# Patient Record
Sex: Female | Born: 1995 | Race: White | Hispanic: No | Marital: Single | Smoking: Never smoker
Health system: Southern US, Community
[De-identification: ages and names within clinical notes are randomized; demographics above are authoritative.]

## PROBLEM LIST (undated history)

## (undated) DIAGNOSIS — Z87820 Personal history of traumatic brain injury: Secondary | ICD-10-CM

## (undated) DIAGNOSIS — R569 Unspecified convulsions: Secondary | ICD-10-CM

## (undated) HISTORY — DX: Unspecified convulsions: R56.9

## (undated) HISTORY — DX: Personal history of traumatic brain injury: Z87.820

---
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
## 2015-07-29 ENCOUNTER — Ambulatory Visit (INDEPENDENT_AMBULATORY_CARE_PROVIDER_SITE_OTHER): Payer: BLUE CROSS/BLUE SHIELD | Admitting: Family Medicine

## 2015-07-29 VITALS — BP 100/68 | HR 53 | Temp 97.7°F | Resp 14 | Ht 67.0 in | Wt 131.0 lb

## 2015-07-29 DIAGNOSIS — J029 Acute pharyngitis, unspecified: Secondary | ICD-10-CM | POA: Diagnosis not present

## 2015-07-29 MED ORDER — PENICILLIN V POTASSIUM 500 MG PO TABS: 500.0000 mg | ORAL_TABLET | Freq: Three times a day (TID) | ORAL | Status: DC

## 2015-07-29 NOTE — Progress Notes (Signed)
@  [facility]@  By signing my name below, I, [name], attest that this documentation has been prepared under the direction and in the presence of Elvina SidleKurt Oluwadarasimi Favor, MD.  Electronically Signed: [name], ED Scribe. 07/29/2015. 3:09 PM.  Patient ID: Dominique Carter MRN: [id number], DOB: Jul 20, 1996, 19 y.o. Date of Encounter: 07/29/2015, 3:09 PM  Primary Physician: Tally DueGUEST, CHRIS WARREN, MD  Chief Complaint:  Chief Complaint  Patient presents with  . Cough    productive  . Sore Throat    HPI: 19 y.o. year old female with history below presents with a sore throat that began 5-6 days ago  She reports associated fatigue and cough, worse at night. .No measured fever, nausea, and emesis.  Pt plays basketball at [employer] and is supposed to return to practice tomorrow.   History reviewed. No pertinent past medical history.   Home Meds: Prior to Admission medications   Not on File    Allergies: No Known Allergies  Social History   Social History  . Marital Status: Single    Spouse Name: N/A  . Number of Children: N/A  . Years of Education: N/A   Occupational History  . Not on file.   Social History Main Topics  . Smoking status: Never Smoker   . Smokeless tobacco: Not on file  . Alcohol Use: Not on file  . Drug Use: Not on file  . Sexual Activity: Not on file   Other Topics Concern  . Not on file   Social History Narrative    Review of Systems: Constitutional: negative for chills, fever, night sweats, weight changes. HEENT: negative for vision changes, hearing loss, congestion, rhinorrhea, ST, epistaxis, or sinus pressure Cardiovascular: negative for chest pain or palpitations Respiratory: negative for hemoptysis, wheezing, shortness of breath Abdominal: negative for abdominal pain, nausea, vomiting, diarrhea, or constipation Dermatological: negative for rash Neurologic: negative for headache, dizziness, or syncope All other systems reviewed and are  otherwise negative with the exception to those above and in the HPI.   Physical Exam: Blood pressure 100/68, pulse 53, temperature 97.7 F (36.5 C), temperature source Oral, resp. rate 14, height 5\' 7"  (1.702 m), weight 131 lb (59.421 kg), SpO2 98 %., Body mass index is 20.51 kg/(m^2). General: Well developed, well nourished, in no acute distress. Head: Normocephalic, atraumatic, eyes without discharge, sclera non-icteric, nares are without discharge. Bilateral auditory canals clear, TM's are without perforation, pearly grey and translucent with reflective cone of light bilaterally. Oral cavity moist, posterior pharynx with erythema   Neck: Supple. No thyromegaly. Full ROM. No lymphadenopathy. Lungs: Clear bilaterally to auscultation without wheezes, rales, or rhonchi. Breathing is unlabored. Msk:  Strength and tone normal for age. Extremities/Skin: Warm and dry. No clubbing or cyanosis. No edema. No rashes or suspicious lesions. Neuro: Alert and oriented X 3. Moves all extremities spontaneously. Gait is normal. CNII-XII grossly in tact. Psych:  Responds to questions appropriately with a normal affect.    ASSESSMENT AND PLAN:  19 y.o. year old female with     ICD-9-CM ICD-10-CM   1. Acute pharyngitis, unspecified etiology 462 J02.9 Culture, Group A Strep     penicillin v potassium (VEETID) 500 MG tablet  This chart was scribed in my presence and reviewed by me personally.   Signed, Elvina SidleKurt Marvene Strohm, MD 07/29/2015 3:09 PM

## 2015-07-31 LAB — CULTURE, GROUP A STREP: Organism ID, Bacteria: NORMAL

## 2016-03-12 DIAGNOSIS — J45990 Exercise induced bronchospasm: Secondary | ICD-10-CM | POA: Insufficient documentation

## 2016-03-12 DIAGNOSIS — Z0000 Encounter for general adult medical examination without abnormal findings: Secondary | ICD-10-CM | POA: Insufficient documentation

## 2018-06-20 ENCOUNTER — Encounter (HOSPITAL_COMMUNITY): Payer: Self-pay | Admitting: *Deleted

## 2018-06-20 ENCOUNTER — Other Ambulatory Visit: Payer: Self-pay

## 2018-06-20 ENCOUNTER — Emergency Department (HOSPITAL_COMMUNITY): Payer: No Typology Code available for payment source

## 2018-06-20 ENCOUNTER — Emergency Department (HOSPITAL_COMMUNITY)
Admission: EM | Admit: 2018-06-20 | Discharge: 2018-06-20 | Disposition: A | Discharge status: Home / Self Care | Payer: No Typology Code available for payment source | Attending: Emergency Medicine | Admitting: Emergency Medicine

## 2018-06-20 DIAGNOSIS — R1032 Left lower quadrant pain: Secondary | ICD-10-CM | POA: Diagnosis present

## 2018-06-20 DIAGNOSIS — R110 Nausea: Secondary | ICD-10-CM | POA: Diagnosis not present

## 2018-06-20 LAB — CBC WITH DIFFERENTIAL/PLATELET
Abs Immature Granulocytes: 0.01 10*3/uL (ref 0.00–0.07)
BASOS ABS: 0 10*3/uL (ref 0.0–0.1)
Basophils Relative: 1 %
EOS PCT: 1 %
Eosinophils Absolute: 0 10*3/uL (ref 0.0–0.5)
HEMATOCRIT: 40.1 % (ref 36.0–46.0)
HEMOGLOBIN: 13.1 g/dL (ref 12.0–15.0)
IMMATURE GRANULOCYTES: 0 %
LYMPHS ABS: 1.5 10*3/uL (ref 0.7–4.0)
LYMPHS PCT: 25 %
MCH: 31.3 pg (ref 26.0–34.0)
MCHC: 32.7 g/dL (ref 30.0–36.0)
MCV: 95.7 fL (ref 80.0–100.0)
Monocytes Absolute: 0.7 10*3/uL (ref 0.1–1.0)
Monocytes Relative: 11 %
NEUTROS PCT: 62 %
NRBC: 0 % (ref 0.0–0.2)
Neutro Abs: 3.7 10*3/uL (ref 1.7–7.7)
Platelets: 274 10*3/uL (ref 150–400)
RBC: 4.19 MIL/uL (ref 3.87–5.11)
RDW: 11.6 % (ref 11.5–15.5)
WBC: 5.9 10*3/uL (ref 4.0–10.5)

## 2018-06-20 LAB — COMPREHENSIVE METABOLIC PANEL
ALT: 14 U/L (ref 0–44)
ANION GAP: 6 (ref 5–15)
AST: 17 U/L (ref 15–41)
Albumin: 4.1 g/dL (ref 3.5–5.0)
Alkaline Phosphatase: 47 U/L (ref 38–126)
BUN: 12 mg/dL (ref 6–20)
CHLORIDE: 106 mmol/L (ref 98–111)
CO2: 27 mmol/L (ref 22–32)
Calcium: 9.8 mg/dL (ref 8.9–10.3)
Creatinine, Ser: 0.99 mg/dL (ref 0.44–1.00)
GFR calc non Af Amer: >60 mL/min (ref >60)
GLUCOSE: 107 mg/dL — AB (ref 70–99)
POTASSIUM: 3.7 mmol/L (ref 3.5–5.1)
Sodium: 139 mmol/L (ref 135–145)
Total Bilirubin: 1.7 mg/dL — ABNORMAL HIGH (ref 0.3–1.2)
Total Protein: 7.4 g/dL (ref 6.5–8.1)

## 2018-06-20 LAB — URINALYSIS, ROUTINE W REFLEX MICROSCOPIC
BILIRUBIN URINE: NEGATIVE
Glucose, UA: NEGATIVE mg/dL
Hgb urine dipstick: NEGATIVE
KETONES UR: NEGATIVE mg/dL
Leukocytes, UA: NEGATIVE
NITRITE: NEGATIVE
PH: 7 (ref 5.0–8.0)
Protein, ur: NEGATIVE mg/dL
SPECIFIC GRAVITY, URINE: 1.02 (ref 1.005–1.030)

## 2018-06-20 LAB — LIPASE, BLOOD: Lipase: 36 U/L (ref 11–51)

## 2018-06-20 LAB — I-STAT BETA HCG BLOOD, ED (MC, WL, AP ONLY)

## 2018-06-20 MED ORDER — NAPROXEN 500 MG PO TABS: 500.0000 mg | ORAL_TABLET | Freq: Two times a day (BID) | ORAL | 0 refills | Status: DC

## 2018-06-20 NOTE — ED Provider Notes (Signed)
Medical screening examination/treatment/procedure(s) were conducted as a shared visit with non-physician practitioner(s) and myself.  I personally evaluated the patient during the encounter.  Clinical Impression:   Final diagnoses:  Left lower quadrant abdominal pain   22 year old female, otherwise healthy, she does have a history of ovarian cyst but has never had any pregnancy issues, she is not currently pregnant, she is not sexually active, she has no urinary symptoms and other than an episode of diarrhea last week she has not had any bowel problems this week.  She did vomit once this morning and the pain is become more persistent in the left lower quadrant.  No fevers and on my exam she has a soft non-peritoneal abdomen with focal tenderness in the left lower quadrant and no CVA tenderness.  Labs pending, urinalysis, pelvic ultrasound to rule out ovarian pathology such as cyst or much less likely torsion.  Patient agreeable   Eber HongMiller, Hessie Varone, MD 06/21/18 1201

## 2018-06-20 NOTE — ED Provider Notes (Signed)
[facility] EMERGENCY DEPARTMENT Provider Note   CSN: [id number] Arrival date & time: 06/20/18  1205   History   Chief Complaint Chief Complaint  Patient presents with  . Abdominal Pain    HPI Dominique Carter is a 23 y.o. female presenting with LLQ abdominal pain onset 1 week ago. Patient reports pain was intermittent at first, but today pain has been constant, sharp, and radiates to LUQ. Patient rates pain as 5/10. Patient reports she has tried tylenol, ibuprofen, and Tums without relief. Patient reports nothing makes the pain better or worse. 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 Patient reports she had one episode of nonbloody vomiting early this morning, but denies any other episodes. Patient reports she had a few episodes of diarrhea 1 week ago, but denies any recent episodes. Patient reports she was told she had an ovarian cyst on her left ovary in the past. Patient reports occasional alcohol use, but denies drug or tobacco use. Patient denies urinary symptoms, vaginal discharge/bleeding. Last LMP at the end of October. Last BM was yesterday.  HPI  History reviewed. No pertinent past medical history.  There are no active problems to display for this patient.   History reviewed. No pertinent surgical history.   OB History   None      Home Medications    Prior to Admission medications   Medication Sig Start Date End Date Taking? Authorizing Provider  naproxen (NAPROSYN) 500 MG tablet Take 1 tablet (500 mg total) by mouth 2 (two) times daily. 06/20/18   Carlyle Basques P, PA-C  penicillin v potassium (VEETID) 500 MG tablet Take 1 tablet (500 mg total) by mouth 3 (three) times daily. 07/29/15   Elvina Sidle, MD    Family History Family History  Problem Relation Age of Onset  . Hypertension Paternal Grandmother   . Cancer Paternal Grandfather     Social History Social History   Tobacco Use  . Smoking  status: Never Smoker  . Smokeless tobacco: Never Used  Substance Use Topics  . Alcohol use: Never    Frequency: Never  . Drug use: Never     Allergies   Patient has no known allergies.   Review of Systems Review of Systems  Constitutional: Negative for activity change, appetite change, chills, fever and unexpected weight change.  HENT: Negative for congestion, rhinorrhea and sore throat.   Eyes: Negative for visual disturbance.  Respiratory: Negative for cough and shortness of breath.   Cardiovascular: Negative for chest pain.  Gastrointestinal: Positive for abdominal pain, nausea and vomiting. Negative for constipation and diarrhea.  Endocrine: Negative for polydipsia, polyphagia and polyuria.  Genitourinary: Negative for dysuria, flank pain, frequency, vaginal bleeding, vaginal discharge and vaginal pain.  Musculoskeletal: Negative for back pain.  Skin: Negative for rash.  Allergic/Immunologic: Negative for immunocompromised state.  Psychiatric/Behavioral: The patient is not nervous/anxious.      Physical Exam Updated Vital Signs BP 108/67 (BP Location: Right Arm)   Pulse (!) 51   Temp 98.2 F (36.8 C) (Oral)   Resp 18   SpO2 98%   Physical Exam  Constitutional: She is oriented to person, place, and time. She appears well-developed and well-nourished. No distress.  HENT:  Head: Normocephalic and atraumatic.  Neck: Normal range of motion. Neck supple.  Cardiovascular: Normal rate, regular rhythm and normal heart sounds. Exam reveals no gallop and no friction rub.  No murmur heard. Pulmonary/Chest: Effort normal and breath  sounds normal. No respiratory distress. She has no wheezes. She has no rales.  Abdominal: Soft. Bowel sounds are normal. She exhibits no distension and no mass. There is generalized tenderness (Pt reports generalized abdominal pain, but states pain is worse in LUQ and LLQ.) and tenderness in the left upper quadrant and left lower quadrant. There is no  rigidity, no rebound, no guarding, no CVA tenderness, no tenderness at McBurney's point and negative Murphy's sign. No hernia.  Musculoskeletal: Normal range of motion.  Neurological: She is alert and oriented to person, place, and time.  Skin: Skin is warm. No rash noted. She is not diaphoretic.  Psychiatric: She has a normal mood and affect.  Nursing note and vitals reviewed.    ED Treatments / Results  Labs (all labs ordered are listed, but only abnormal results are displayed) Labs Reviewed  COMPREHENSIVE METABOLIC PANEL - Abnormal; Notable for the following components:      Result Value   Glucose, Bld 107 (*)    Total Bilirubin 1.7 (*)    All other components within normal limits  LIPASE, BLOOD  CBC WITH DIFFERENTIAL/PLATELET  URINALYSIS, ROUTINE W REFLEX MICROSCOPIC  I-STAT BETA HCG BLOOD, ED ([facility], [facility], [facility] ONLY)    EKG None  Radiology US Transvaginal Non-ob  Result Date: 06/20/2018 CLINICAL DATA:  Initial evaluation for acute left lower quadrant pain for or 1 week. History of ovarian cyst. EXAM: TRANSABDOMINAL ULTRASOUND OF PELVIS DOPPLER ULTRASOUND OF OVARIES TECHNIQUE: Transabdominal ultrasound examination of the pelvis was performed including evaluation of the uterus, ovaries, adnexal regions, and pelvic cul-de-sac. Color and duplex Doppler ultrasound was utilized to evaluate blood flow to the ovaries. COMPARISON:  None. FINDINGS: Uterus Measurements: 4.9 x 2.9 x 3.9 cm = volume: 286 mL. No fibroids or other mass visualized. Endometrium Thickness: 9.5 mm.  No focal abnormality visualized. Right ovary Measurements: 3.1 x 2.1 x 3.3 cm = volume: 11.4 mL. Normal appearance/no adnexal mass. Left ovary Measurements: 2.9 x 2.2 x 3.1 cm = volume: 10.3 mL. Normal appearance/no adnexal mass. Pulsed Doppler evaluation demonstrates normal low-resistance arterial and venous waveforms in both ovaries. Other: Trace free physiologic fluid within the pelvis. IMPRESSION: Normal pelvic ultrasound.  No evidence for torsion or other acute abnormality. Electronically Signed   By: Rise Mu M.D.   On: 06/20/2018 15:13   US Pelvis Complete  Result Date: 06/20/2018 CLINICAL DATA:  Initial evaluation for acute left lower quadrant pain for or 1 week. History of ovarian cyst. EXAM: TRANSABDOMINAL ULTRASOUND OF PELVIS DOPPLER ULTRASOUND OF OVARIES TECHNIQUE: Transabdominal ultrasound examination of the pelvis was performed including evaluation of the uterus, ovaries, adnexal regions, and pelvic cul-de-sac. Color and duplex Doppler ultrasound was utilized to evaluate blood flow to the ovaries. COMPARISON:  None. FINDINGS: Uterus Measurements: 4.9 x 2.9 x 3.9 cm = volume: 286 mL. No fibroids or other mass visualized. Endometrium Thickness: 9.5 mm.  No focal abnormality visualized. Right ovary Measurements: 3.1 x 2.1 x 3.3 cm = volume: 11.4 mL. Normal appearance/no adnexal mass. Left ovary Measurements: 2.9 x 2.2 x 3.1 cm = volume: 10.3 mL. Normal appearance/no adnexal mass. Pulsed Doppler evaluation demonstrates normal low-resistance arterial and venous waveforms in both ovaries. Other: Trace free physiologic fluid within the pelvis. IMPRESSION: Normal pelvic ultrasound. No evidence for torsion or other acute abnormality. Electronically Signed   By: Rise Mu M.D.   On: 06/20/2018 15:13   [place] Art/ven Flow Abd Pelv Doppler  Result Date: 06/20/2018 CLINICAL DATA:  Initial evaluation for acute  left lower quadrant pain for or 1 week. History of ovarian cyst. EXAM: TRANSABDOMINAL ULTRASOUND OF PELVIS DOPPLER ULTRASOUND OF OVARIES TECHNIQUE: Transabdominal ultrasound examination of the pelvis was performed including evaluation of the uterus, ovaries, adnexal regions, and pelvic cul-de-sac. Color and duplex Doppler ultrasound was utilized to evaluate blood flow to the ovaries. COMPARISON:  None. FINDINGS: Uterus Measurements: 4.9 x 2.9 x 3.9 cm = volume: 286 mL. No fibroids or other mass  visualized. Endometrium Thickness: 9.5 mm.  No focal abnormality visualized. Right ovary Measurements: 3.1 x 2.1 x 3.3 cm = volume: 11.4 mL. Normal appearance/no adnexal mass. Left ovary Measurements: 2.9 x 2.2 x 3.1 cm = volume: 10.3 mL. Normal appearance/no adnexal mass. Pulsed Doppler evaluation demonstrates normal low-resistance arterial and venous waveforms in both ovaries. Other: Trace free physiologic fluid within the pelvis. IMPRESSION: Normal pelvic ultrasound. No evidence for torsion or other acute abnormality. Electronically Signed   By: Rise MuBenjamin  McClintock M.D.   On: 06/20/2018 15:13    Procedures Procedures (including critical care time)  Medications Ordered in ED Medications - No data to display   Initial Impression / Assessment and Plan / ED Course  I have reviewed the triage vital signs and the nursing notes.  Pertinent labs & imaging results that were available during my care of the patient were reviewed by me and considered in my medical decision making (see chart for details).  Clinical Course as of Jun 20 1558  Mon Jun 20, 2018  1524 Transvaginal ultrasound reveals patient has no pelvic abnormalities and no evidence of an ovarian torsion.    [AH]  1524 Ua, lipase, and CBC unremarkable. CMP is unremarkable with the exception of elevated total bilirubin and elevated glucose. Will advise patient to follow up with PCP regarding these results.    [AH]    Clinical Course User Index [AH] Leretha DykesHernandez, Kanye Depree P, [place]PA-C    Patient presents with complaint of abdominal pain. Patient nontoxic appearing, in no apparent distress, vitals WNL, stable.  Labs/Imaging: Ordered CBC and CMP to evaluate for signs of infection and electrolyte abnormalities. Ordered UA and lipase to further evaluate abdominal pain since patient was tender diffusely on my exam. Ordered transvaginal ultrasound to rule out ovarian torsion.   Assessment/Plan: Patient is nontoxic, nonseptic appearing, in no apparent  distress.  Patient's pain and other symptoms adequately managed in emergency department. Labs, imaging and vitals reviewed.  Patient does not meet the SIRS or Sepsis criteria.  On repeat exam patient does not have a surgical abdomin and there are no peritoneal signs.  No indication of appendicitis, bowel obstruction, bowel perforation, cholecystitis, diverticulitis, PID or ectopic pregnancy.  Patient discharged home with symptomatic treatment, naproxen, and given strict instructions for follow-up with their primary care physician.  I have also discussed reasons to return immediately to the ER.  Patient expresses understanding and agrees with plan.  Findings and plan of care discussed with supervising physician Dr. Hyacinth MeekerMiller who personally evaluated and examined this patient.    Final Clinical Impressions(s) / ED Diagnoses   Final diagnoses:  Left lower quadrant abdominal pain    ED Discharge Orders         Ordered    naproxen (NAPROSYN) 500 MG tablet  2 times daily     06/20/18 [address]1535           Hillman Attig AustinP, [place]PA-C 06/20/18 1559    Eber HongMiller, Brian, MD 06/21/18 1202

## 2018-06-20 NOTE — ED Triage Notes (Signed)
Pt is here with LLQ pain for one week and vomited today. LMP in October

## 2018-06-20 NOTE — ED Notes (Signed)
Pt. Given a urine cup to provide sample. Pt. was asked when first arriving and pt. Was unable to provide sample at the time.

## 2018-06-20 NOTE — Discharge Instructions (Addendum)
You have been seen today for abdominal pain. Please read and follow all provided instructions.   1. Medications: Naproxen for abdominal pain, usual home medications 2. Treatment: rest, drink plenty of fluids 3. Follow Up: Please follow up with your primary doctor in 2 days for discussion of your diagnoses and further evaluation after today's visit; if you do not have a primary care doctor use the resource guide provided to find one; Please return to the ER for any new or worsening symptoms.   Take medications as prescribed. Return to the emergency room for worsening condition or new concerning symptoms. Follow up with your regular doctor. If you don't have a regular doctor use one of the numbers below to establish a primary care doctor.  Please seek immediate care if you have any develop any of the following symptoms: The pain does not go away.  You have a fever.  You keep throwing up (vomiting).  The pain is felt only in portions of the abdomen. Pain in the right side could possibly be appendicitis. In an adult, pain in the left lower portion of the abdomen could be colitis or diverticulitis.  You pass bloody or black tarry stools.  There is bright red blood in the stool.  The constipation stays for more than 4 days.  There is belly (abdominal) or rectal pain.  You do not seem to be getting better.  You have any questions or concerns.

## 2018-10-03 ENCOUNTER — Encounter: Payer: Self-pay | Admitting: Neurology

## 2018-10-03 ENCOUNTER — Ambulatory Visit: Payer: No Typology Code available for payment source | Admitting: Neurology

## 2018-10-03 VITALS — BP 105/67 | HR 60 | Ht 68.0 in | Wt 133.0 lb

## 2018-10-03 DIAGNOSIS — R404 Transient alteration of awareness: Secondary | ICD-10-CM

## 2018-10-03 MED ORDER — LEVETIRACETAM 500 MG PO TABS: 500.0000 mg | ORAL_TABLET | Freq: Two times a day (BID) | ORAL | 11 refills | Status: DC

## 2018-10-03 NOTE — Progress Notes (Signed)
PATIENT: Dominique N KYOMI HECTOR[birth year]  Chief Complaint  Patient presents with  . Possible Seizures    She is here with her grandmother today.  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  She backed up again the wall because she felt she was going pass out.  She fell to the floor and states bystanders witnessed her eyes fluttering and legs shaking.  The entire episode lasted about five minutes. She was treated in the ED in [place].  No further events have occurred.  Marland Kitchen PCP    Verlon Au, MD     HISTORICAL  Dominique Carter is a 23 year old female, seen in request by her primary care physician Dr. Leavy Cella, Leanora Cover, for evaluation of passing out episode, initial evaluation was on October 03, 2018.  I have reviewed and summarized the referring note from the referring physician.  She was previously healthy, working 2 jobs, is a Pension scheme manager, also coaches high school basketball.  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, she was leading a student to the resting area, she suddenly felt her head was spinning, body was warm,, vision fading away, then she fell to the ground, apparently had prolonged loss of consciousness, when she came around, she was confused, was surrounded by paramedic, she also described whole body achy pain, confusion, vision fixed to the left gaze, to go to move about, there was no tongue biting, no urinary incontinence, I reviewed emergency record, there was described seizure activity by paramedics, 3 seizures 2 minutes apart, at emergency room, neuroscience was stable, CT head without contrast.  Laboratory evaluations showed normal CBC, hemoglobin of 13.1, CMP, creatinine of 0.85,  Now she continue complains recurrent spells of transient lapse of memory, difficulty focusing, snap out of it in few seconds, each week she also complains  of intermittent left frontal, right occipital area headache  REVIEW OF SYSTEMS: Full 14 system review of systems performed and notable only for headache, seizure, passing out, tremor All other review of systems were negative.  ALLERGIES: No Known Allergies  HOME MEDICATIONS: No current outpatient medications on file.   No current facility-administered medications for this visit.     PAST MEDICAL HISTORY: Past Medical History:  Diagnosis Date  . History of concussion    high school while playing basketball  . Seizure-like activity (HCC)     PAST SURGICAL HISTORY: History reviewed. No pertinent surgical history.  FAMILY HISTORY: Family History  Problem Relation Age of Onset  . Headache Mother   . Lung cancer Paternal Grandfather   . Hypertension Father   . Colon cancer Maternal Grandfather     SOCIAL HISTORY: Social History   Socioeconomic History  . Marital status: Single    Spouse name: Not on file  . Number of children: 0  . Years of education:  college  . Highest education level: Bachelor's degree (e.g., BA, AB, BS)  Occupational History  . Occupation: special needs teacher  Social Needs  . Financial resource strain: Not on file  . Food insecurity:    Worry: Not on file    Inability: Not on file  . Transportation needs:    Medical: Not on file    Non-medical: Not on file  Tobacco Use  . Smoking status: Never Smoker  . Smokeless tobacco: Never Used  Substance and Sexual Activity  . Alcohol use: Yes  Frequency: Never    Comment: rarely  . Drug use: Never  . Sexual activity: Not on file  Lifestyle  . Physical activity:    Days per week: Not on file    Minutes per session: Not on file  . Stress: Not on file  Relationships  . Social connections:    Talks on phone: Not on file    Gets together: Not on file    Attends religious service: Not on file    Active member of club or organization: Not on file    Attends meetings of clubs or organizations:  Not on file    Relationship status: Not on file  . Intimate partner violence:    Fear of current or ex partner: Not on file    Emotionally abused: Not on file    Physically abused: Not on file    Forced sexual activity: Not on file  Other Topics Concern  . Not on file  Social History Narrative   Lives at home alone.   Left-handed.   Occasional caffeine use.     PHYSICAL EXAM   Vitals:   10/03/18 1258  BP: 105/67  Pulse: 60  Weight: 133 lb (60.3 kg)  Height: 5\' 8"  (1.727 m)    Not recorded      Body mass index is 20.22 kg/m.  PHYSICAL EXAMNIATION:  Gen: NAD, conversant, well nourised, obese, well groomed                     Cardiovascular: Regular rate rhythm, no peripheral edema, warm, nontender. Eyes: Conjunctivae clear without exudates or hemorrhage Neck: Supple, no carotid bruits. Pulmonary: Clear to auscultation bilaterally   NEUROLOGICAL EXAM:  MENTAL STATUS: Speech:    Speech is normal; fluent and spontaneous with normal comprehension.  Cognition:     Orientation to time, place and person     Normal recent and remote memory     Normal Attention span and concentration     Normal Language, naming, repeating,spontaneous speech     Fund of knowledge   CRANIAL NERVES: CN II: Visual fields are full to confrontation. Fundoscopic exam is normal with sharp discs and no vascular changes. Pupils are round equal and briskly reactive to light. CN III, IV, VI: extraocular movement are normal. No ptosis. CN V: Facial sensation is intact to pinprick in all 3 divisions bilaterally. Corneal responses are intact.  CN VII: Face is symmetric with normal eye closure and smile. CN VIII: Hearing is normal to rubbing fingers CN IX, X: Palate elevates symmetrically. Phonation is normal. CN XI: Head turning and shoulder shrug are intact CN XII: Tongue is midline with normal movements and no atrophy.  MOTOR: There is no pronator drift of out-stretched arms. Muscle bulk and  tone are normal. Muscle strength is normal.  REFLEXES: Reflexes are 2+ and symmetric at the biceps, triceps, knees, and ankles. Plantar responses are flexor.  SENSORY: Intact to light touch, pinprick, positional sensation and vibratory sensation are intact in fingers and toes.  COORDINATION: Rapid alternating movements and fine finger movements are intact. There is no dysmetria on finger-to-nose and heel-knee-shin.    GAIT/STANCE: Posture is normal. Gait is steady with normal steps, base, arm swing, and turning. Heel and toe walking are normal. Tandem gait is normal.  Romberg is absent.   DIAGNOSTIC DATA (LABS, IMAGING, TESTING) - I reviewed patient records, labs, notes, testing and imaging myself where available.   ASSESSMENT AND PLAN  Dominique Carter is  a 23 y.o. female   Passing out spells on July 12, 2018,  There was EMS described seizure-like activity,  Patient also complains of recurrent spells of staring, snapped out of it, with transient lapse of memory  Differentiation diagnosis include complex partial seizure with secondary generalization  Complete evaluation with MRI of the brain with and without contrast  EEG  No driving until episode free for 6 months  Starting Keppra 500 mg twice daily   Levert Feinstein, M.D. Ph.D.  [facility] [address], [address] [place], [place] [address] Ph: [phone] Fax: [phone]  CC: Verlon Au, MD

## 2018-10-04 ENCOUNTER — Telehealth: Payer: Self-pay | Admitting: Neurology

## 2018-10-04 NOTE — Telephone Encounter (Signed)
unable to leave vmail the phone was not allowing to leave vmail

## 2018-10-04 NOTE — Telephone Encounter (Signed)
[facility] order sent to [facility]. They will obtain the auth and reach out to the pt to schedule.

## 2018-10-06 NOTE — Telephone Encounter (Signed)
Patient mom called stated she would like the MRI order sent to [facility]. I faxed order to [phone] attn: Irving Burton per patient mom.

## 2018-11-14 ENCOUNTER — Other Ambulatory Visit: Payer: No Typology Code available for payment source

## 2018-12-13 ENCOUNTER — Telehealth: Payer: Self-pay | Admitting: Neurology

## 2018-12-13 NOTE — Telephone Encounter (Signed)
I faxed the order to [facility].

## 2018-12-13 NOTE — Telephone Encounter (Signed)
Pt would like MRI order faxed to [facility] so that she can schedule  Fax # [phone]

## 2018-12-29 ENCOUNTER — Ambulatory Visit: Payer: No Typology Code available for payment source | Admitting: Neurology

## 2019-01-17 ENCOUNTER — Telehealth (INDEPENDENT_AMBULATORY_CARE_PROVIDER_SITE_OTHER): Payer: No Typology Code available for payment source | Admitting: Neurology

## 2019-01-17 ENCOUNTER — Encounter: Payer: Self-pay | Admitting: Neurology

## 2019-01-17 ENCOUNTER — Other Ambulatory Visit: Payer: Self-pay

## 2019-01-17 DIAGNOSIS — R404 Transient alteration of awareness: Secondary | ICD-10-CM

## 2019-01-17 MED ORDER — LEVETIRACETAM ER 500 MG PO TB24: 1000.0000 mg | ORAL_TABLET | Freq: Every day | ORAL | 11 refills | Status: DC

## 2019-01-17 NOTE — Progress Notes (Signed)
PATIENT: Dominique Carter DOB: 1996/07/01  No chief complaint on file.    HISTORICAL  Dominique Carter is a 23 year old female, seen in request by her primary care physician Dr. Luciana Axe, Dola Factor, for evaluation of passing out episode, initial evaluation was on October 03, 2018.  I have reviewed and summarized the referring note from the referring physician.  She was previously healthy, working 2 jobs, is a Chief Technology Officer, also coaches high school basketball.  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, she was leading a student to the resting area, she suddenly felt her head was spinning, body was warm,, vision fading away, then she fell to the ground, apparently had prolonged loss of consciousness, when she came around, she was confused, was surrounded by paramedic, she also described whole body achy pain, confusion, vision fixed to the left gaze, to go to move about, there was no tongue biting, no urinary incontinence, I reviewed emergency record, there was described seizure activity by paramedics, 3 seizures 2 minutes apart, at emergency room, neuroscience was stable, CT head without contrast.  Laboratory evaluations showed normal CBC, hemoglobin of 13.1, CMP, creatinine of 0.85,  Now she continue complains recurrent spells of transient lapse of memory, difficulty focusing, snap out of it in few seconds, each week she also complains of intermittent left frontal, right occipital area headache    Virtual Visit via video  I connected with Dominique Carter on 01/17/19 at  by video and verified that I am speaking with the correct person using two identifiers.   I discussed the limitations, risks, security and privacy concerns of performing an evaluation and management service by video and the availability of in person appointments. I also discussed with the patient that there may be a patient responsible charge related to this service. The patient  expressed understanding and agreed to proceed.  HISTORICAL  She reported MRI at [facility], I personally reviewed MRI of the brain with without contrast on Dec 15, 2018 that was normal  EEG is pending  She stopped taking Keppra 500 mg once a day, has much less recurrent staring spells, she could not tolerate twice a day due to side effect of dizziness,   Observations/Objective: I have reviewed problem lists, medications, allergies.  Awake, alert, oriented to history taking and casual conversation  Assessment and Plan: Confusion spells, Seizure-like activity on July 12, 2018  Probable complex partial seizure  Responded to Keppra 500 mg daily, will change to Keppra XR 500 mg 2 tablets every night  Normal MRI of the brain  EEG is pending   Follow Up Instructions:  With nurse practitioner Sarah in 6 months  I discussed the assessment and treatment plan with the patient. The patient was provided an opportunity to ask questions and all were answered. The patient agreed with the plan and demonstrated an understanding of the instructions.   The patient was advised to call back or seek an in-person evaluation if the symptoms worsen or if the condition fails to improve as anticipated.  I provided 30 minutes of non-face-to-face time during this encounter.  REVIEW OF SYSTEMS: Full 14 system review of systems performed and notable only for as above All other review of systems were negative.  ALLERGIES: No Known Allergies  HOME MEDICATIONS: Current Outpatient Medications  Medication Sig Dispense Refill  . levETIRAcetam (KEPPRA XR) 500 MG 24 hr tablet Take 2 tablets (1,000 mg total) by mouth daily. 60 tablet  11  . levETIRAcetam (KEPPRA) 500 MG tablet Take 1 tablet (500 mg total) by mouth 2 (two) times daily. 60 tablet 11   No current facility-administered medications for this visit.     PAST MEDICAL HISTORY: Past Medical History:  Diagnosis Date  . History of  concussion    high school while playing basketball  . Seizure-like activity (HCC)     PAST SURGICAL HISTORY: No past surgical history on file.  FAMILY HISTORY: Family History  Problem Relation Age of Onset  . Headache Mother   . Lung cancer Paternal Grandfather   . Hypertension Father   . Colon cancer Maternal Grandfather     SOCIAL HISTORY:   Social History   Socioeconomic History  . Marital status: Single    Spouse name: Not on file  . Number of children: 0  . Years of education:  college  . Highest education level: Bachelor's degree (e.g., BA, AB, BS)  Occupational History  . Occupation: special needs teacher  Social Needs  . Financial resource strain: Not on file  . Food insecurity    Worry: Not on file    Inability: Not on file  . Transportation needs    Medical: Not on file    Non-medical: Not on file  Tobacco Use  . Smoking status: Never Smoker  . Smokeless tobacco: Never Used  Substance and Sexual Activity  . Alcohol use: Yes    Frequency: Never    Comment: rarely  . Drug use: Never  . Sexual activity: Not on file  Lifestyle  . Physical activity    Days per week: Not on file    Minutes per session: Not on file  . Stress: Not on file  Relationships  . Social Musicianconnections    Talks on phone: Not on file    Gets together: Not on file    Attends religious service: Not on file    Active member of club or organization: Not on file    Attends meetings of clubs or organizations: Not on file    Relationship status: Not on file  . Intimate partner violence    Fear of current or ex partner: Not on file    Emotionally abused: Not on file    Physically abused: Not on file    Forced sexual activity: Not on file  Other Topics Concern  . Not on file  Social History Narrative   Lives at home alone.   Left-handed.   Occasional caffeine use.    Levert FeinsteinYijun Tonio Seider, M.D. Ph.D.  [facility] [address], [address] [place], [place] [address] Ph:  [phone] Fax: [phone]  CC: Referring Provider

## 2019-02-01 ENCOUNTER — Other Ambulatory Visit: Payer: Self-pay

## 2019-02-01 ENCOUNTER — Ambulatory Visit (INDEPENDENT_AMBULATORY_CARE_PROVIDER_SITE_OTHER): Payer: No Typology Code available for payment source | Admitting: Neurology

## 2019-02-01 DIAGNOSIS — R55 Syncope and collapse: Secondary | ICD-10-CM | POA: Diagnosis not present

## 2019-02-01 DIAGNOSIS — R404 Transient alteration of awareness: Secondary | ICD-10-CM

## 2019-02-06 ENCOUNTER — Ambulatory Visit: Payer: Self-pay | Admitting: Neurology

## 2019-02-09 NOTE — Procedures (Signed)
   HISTORY: 23 years old female presented with passing out episode.  TECHNIQUE:  This is a routine 16 channel EEG recording with one channel devoted to a limited EKG recording.  It was performed during wakefulness, drowsiness and asleep.  Hyperventilation and photic stimulation were performed as activating procedures.  There are minimum muscle and movement artifact noted.  Upon maximum arousal, posterior dominant waking rhythm consistent of rhythmic alpha range activity, with frequency of 9 Hz. Activities are symmetric over the bilateral posterior derivations and attenuated with eye opening.  Hyperventilation produced mild/moderate buildup with higher amplitude and the slower activities noted.  Photic stimulation did not alter the tracing.  During EEG recording, patient developed drowsiness and no deeper stage of sleep was achieved.  During EEG recording, there was no epileptiform discharge noted.  EKG demonstrate sinus rhythm, with heart rate of   CONCLUSION: This is a  normal awake EEG.  There is no electrodiagnostic evidence of epileptiform discharge.  Marcial Pacas, M.D. Ph.D.  [facility] [place], [place] [address] Phone: [phone] Fax:      [phone]

## 2019-02-10 ENCOUNTER — Other Ambulatory Visit: Payer: Self-pay | Admitting: Neurology

## 2019-02-10 ENCOUNTER — Telehealth: Payer: Self-pay | Admitting: Neurology

## 2019-02-10 NOTE — Telephone Encounter (Signed)
Pt mother states the results to EEG show on Mychart but not if pt should continue on medication, mother is asking for a call on Monday

## 2019-02-13 NOTE — Telephone Encounter (Signed)
I returned the call to the patient's mother.  She is aware that her daughter should continue Keppra XR, as prescribed, and to plan to follow up in six months.

## 2019-02-13 NOTE — Addendum Note (Signed)
Addended by: Desmond Lope on: 02/13/2019 08:23 AM   Modules accepted: Orders

## 2019-06-29 IMAGING — US US PELVIS COMPLETE
1 series · 14 of 25 positions shown · non-contrast
Comparison: None.

CLINICAL DATA: Initial evaluation for acute left lower quadrant
pain for or 1 week. History of ovarian cyst.

EXAM:
TRANSABDOMINAL ULTRASOUND OF PELVIS
DOPPLER ULTRASOUND OF OVARIES
TECHNIQUE: Transabdominal ultrasound examination of the pelvis was performed
including evaluation of the uterus, ovaries, adnexal regions, and
pelvic cul-de-sac.
Color and duplex Doppler ultrasound was utilized to evaluate blood
flow to the ovaries.

[Series 1: us pelvis complete · 0.20mm/px · 14 of 66 slices shown]
[im 1/66]
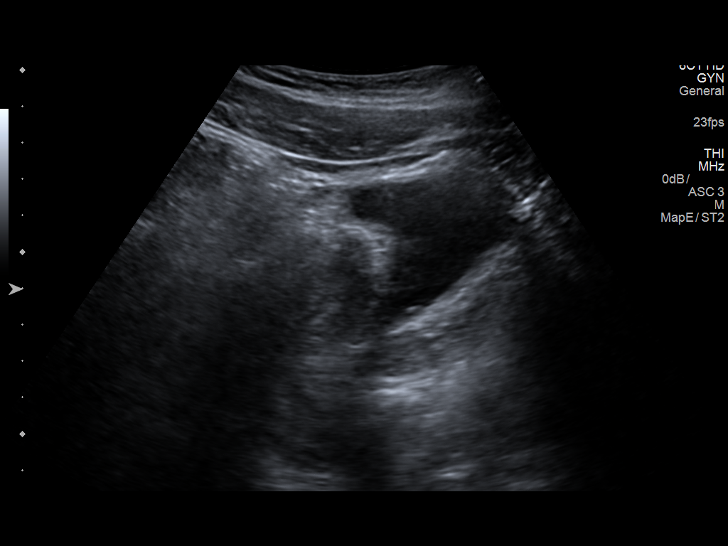
[im 6/66]
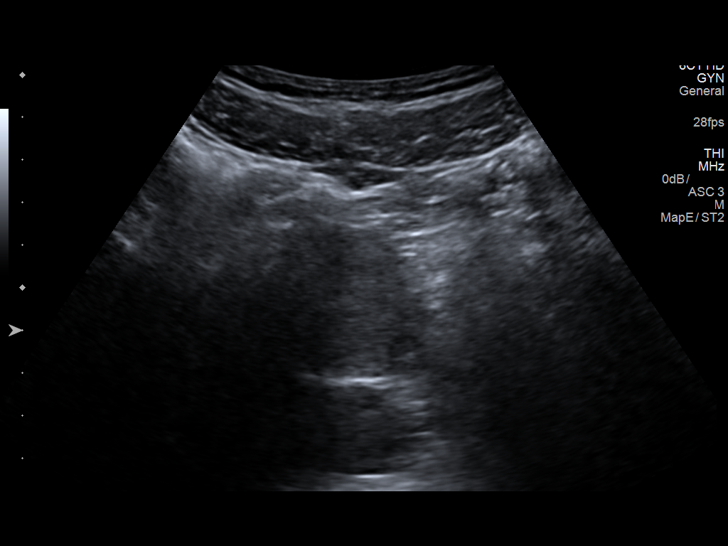
[im 11/66]
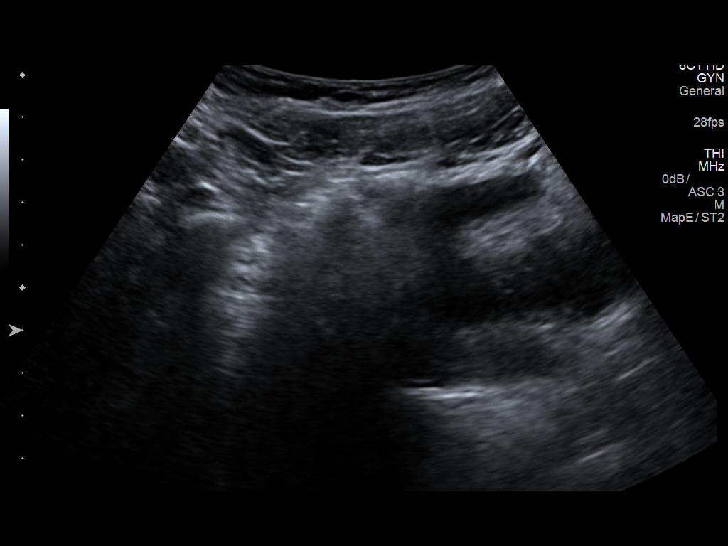
[im 17/66]
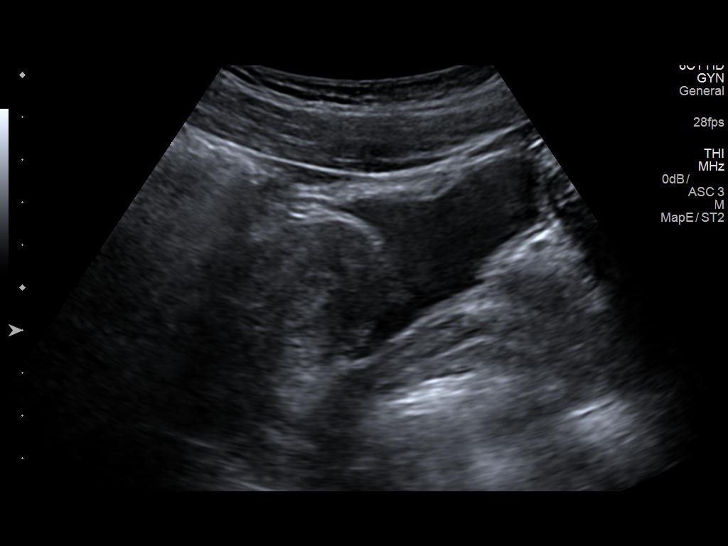
[im 22/66]
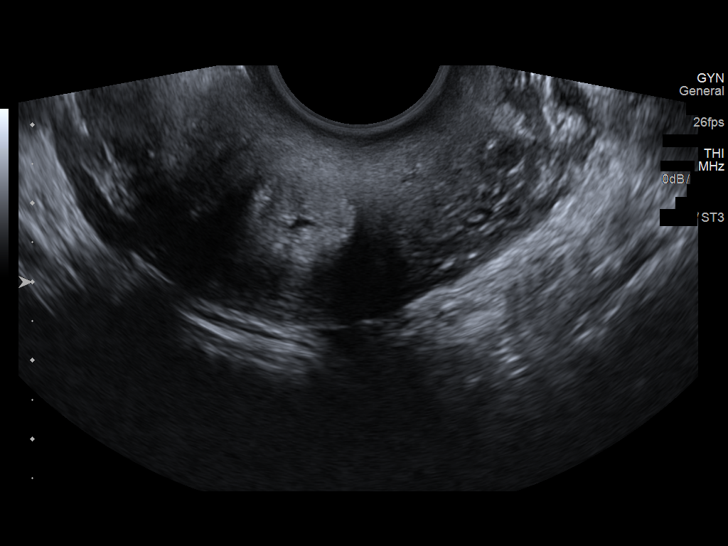
[im 25/66]
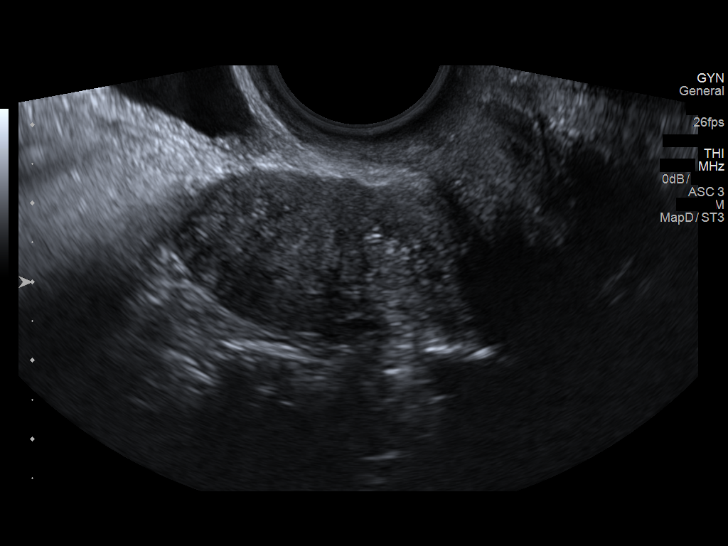
[im 30/66]
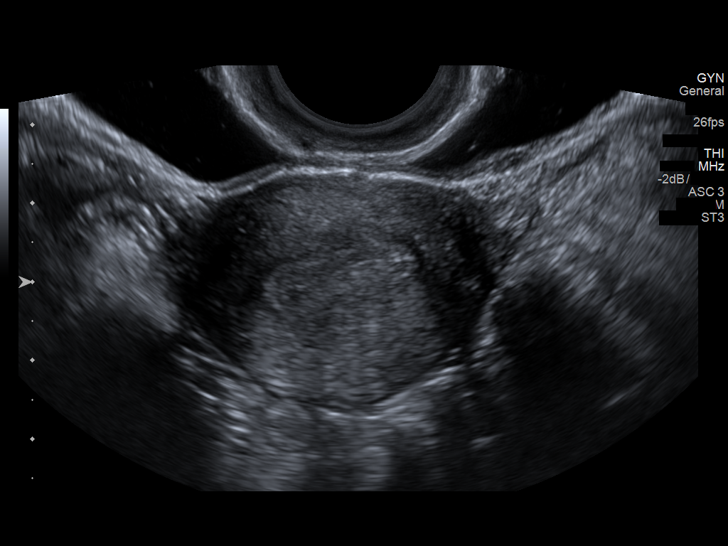
[im 36/66]
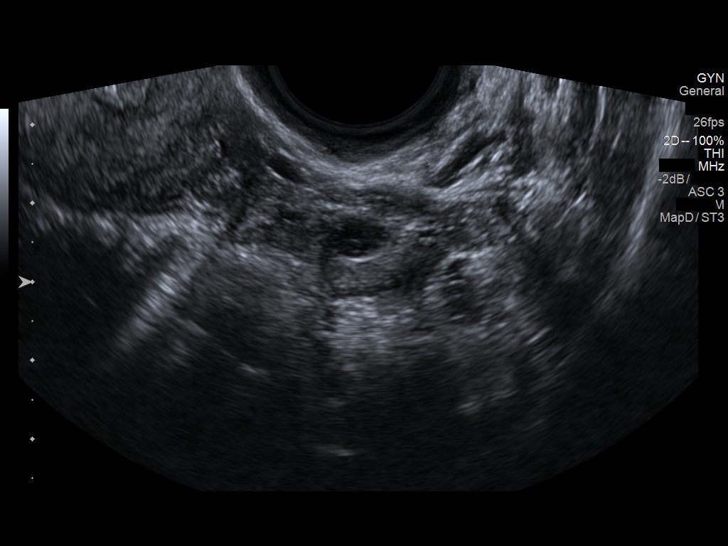
[im 41/66]
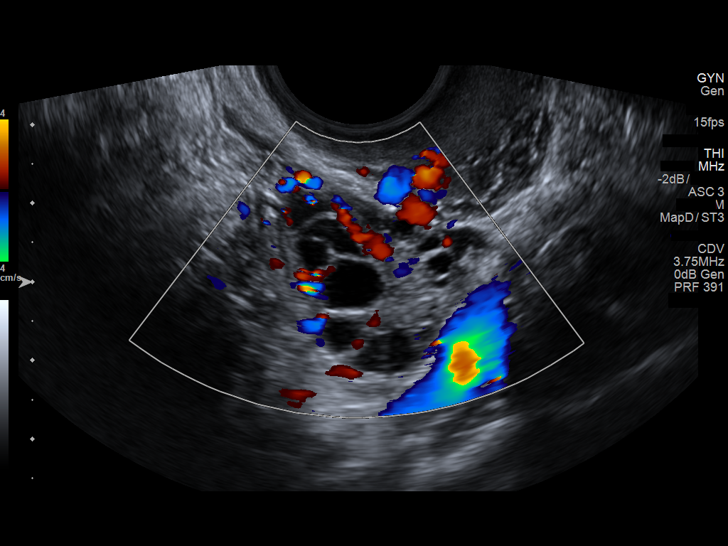
[im 44/66]
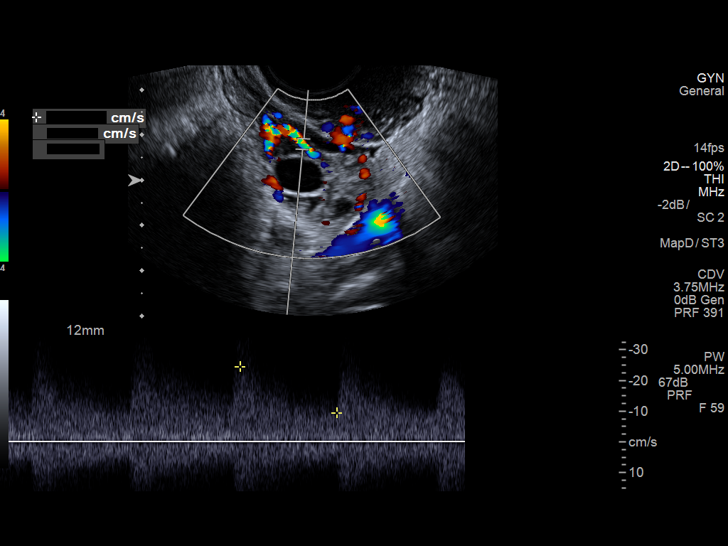
[im 49/66]
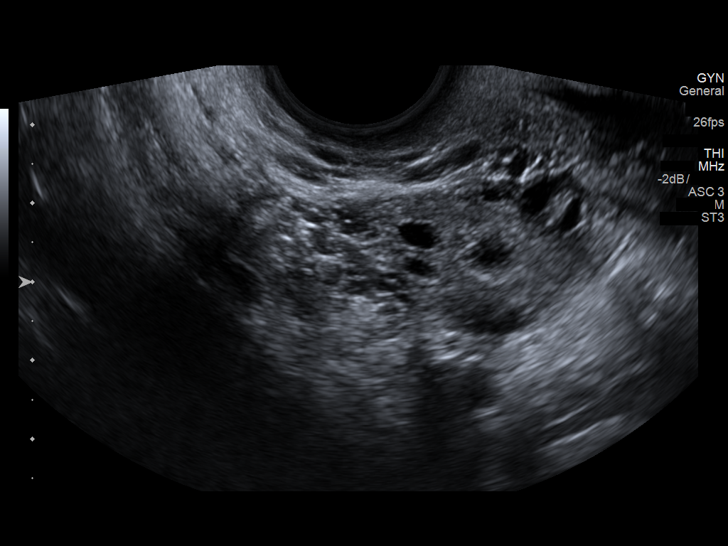
[im 55/66]
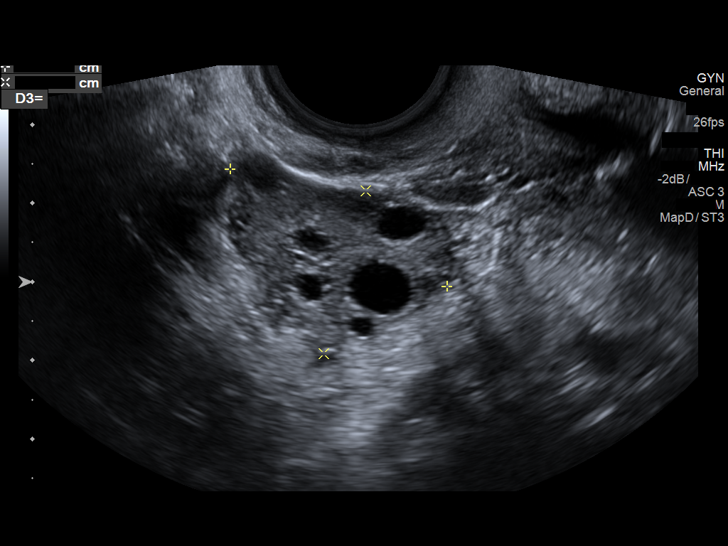
[im 60/66]
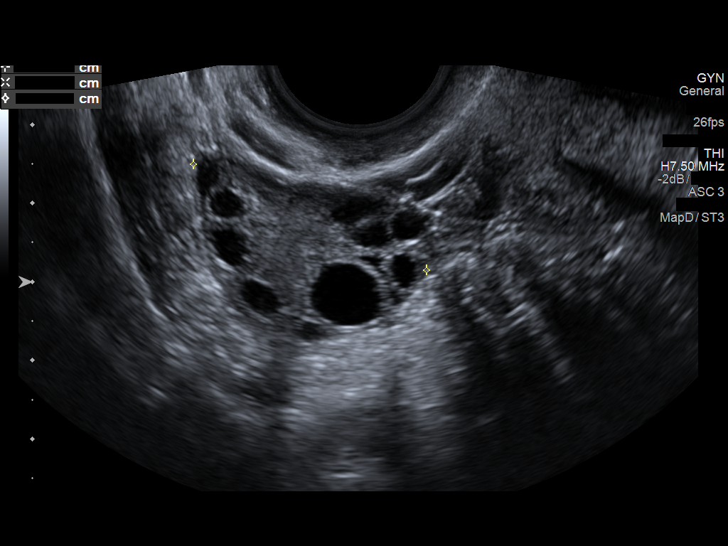
[im 66/66]
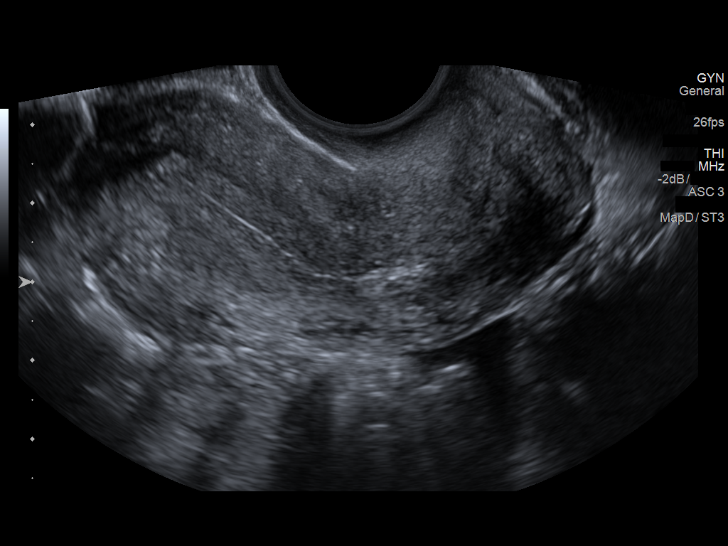

[14 of 25 positions shown; findings below may reference images not displayed]

FINDINGS: Uterus

Measurements: 4.9 x 2.9 x 3.9 cm = volume: 286 mL. No fibroids or
other mass visualized.

Endometrium

Thickness: 9.5 mm.  No focal abnormality visualized.

Right ovary

Measurements: 3.1 x 2.1 x 3.3 cm = volume: 11.4 mL. Normal
appearance/no adnexal mass.

Left ovary

Measurements: 2.9 x 2.2 x 3.1 cm = volume: 10.3 mL. Normal
appearance/no adnexal mass.

Pulsed Doppler evaluation demonstrates normal low-resistance
arterial and venous waveforms in both ovaries.

Other: Trace free physiologic fluid within the pelvis.
IMPRESSION: Normal pelvic ultrasound. No evidence for torsion or other acute
abnormality.

## 2019-09-26 ENCOUNTER — Telehealth: Payer: Self-pay | Admitting: Neurology

## 2019-09-26 NOTE — Telephone Encounter (Signed)
Pt's father Caryn Bee on [place] called wanting to speak to the RN regarding the headaches his daughter is having and also the medications she is on. Please advise.

## 2019-09-26 NOTE — Telephone Encounter (Signed)
I spoke to the patient's father on [place]. She has a pending appt on 10/05/19. He wanted it noted, that since being on Keppra, she has started having mood swings, headaches and weight loss. They would like to discuss a medication change at appt next week.

## 2019-10-05 ENCOUNTER — Other Ambulatory Visit: Payer: Self-pay

## 2019-10-05 ENCOUNTER — Ambulatory Visit: Payer: PRIVATE HEALTH INSURANCE | Admitting: Neurology

## 2019-10-05 ENCOUNTER — Encounter: Payer: Self-pay | Admitting: Neurology

## 2019-10-05 VITALS — BP 98/63 | HR 59 | Temp 97.3°F | Ht 68.0 in | Wt 118.5 lb

## 2019-10-05 DIAGNOSIS — R404 Transient alteration of awareness: Secondary | ICD-10-CM

## 2019-10-05 NOTE — Progress Notes (Signed)
PATIENT: Dominique Carter DOB: [birth year]  Chief Complaint  Patient presents with   Seizure-like activity    She is here with her father, Dominique Carter. She was taking Keppra XR 500mg , two tablets at bedtime. She has been out of sorts since starting the medication. She experienced mood swings, headaches and weight loss. One week ago, she decreased the dosage down to 0.5 tablet at bedtime. No further seizure-like activity.      HISTORICAL  Dominique Carter is a 24 year old female, seen in request by her primary care physician Dr. Luciana Carter, Dola Factor, for evaluation of passing out episode, initial evaluation was on October 03, 2018.  I have reviewed and summarized the referring note from the referring physician.  She was previously healthy, working 2 jobs, is a Chief Technology Officer, also coaches high school basketball.  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, she was leading a student to the resting area, she suddenly felt her head was spinning, body was warm,, vision fading away, then she fell to the ground, apparently had prolonged loss of consciousness, when she came around, she was confused, was surrounded by paramedic, she also described whole body achy pain, confusion, vision fixed to the left gaze, to go to move about, there was no tongue biting, no urinary incontinence, I reviewed emergency record, there was described seizure activity by paramedics, 3 seizures 2 minutes apart, at emergency room, neuroscience was stable, CT head without contrast.  Laboratory evaluations showed normal CBC, hemoglobin of 13.1, CMP, creatinine of 0.85,  Now she continue complains recurrent spells of transient lapse of memory, difficulty focusing, snap out of it in few seconds, each week she also complains of intermittent left frontal, right occipital area headache  UPDATE October 05 2019: She is accompanied by her father at today's clinical visit, reported patient has  significant mood swing since last visit, poor appetite, sometimes crying hysterically, she did not have recurrent passing out episode, only taking Keppra xr 500 mg half tablets every night,  EEG was normal on February 01, 2019. MRI of the brain with and without contrast from [facility] on Dec 15, 2018 that was normal  She also has occasionally mild headaches, used to work as a Agricultural engineer, and basketball coach for high school  REVIEW OF SYSTEMS: Full 14 system review of systems performed and notable only for as above ALLERGIES: No Known Allergies  HOME MEDICATIONS: Current Outpatient Medications  Medication Sig Dispense Refill   levETIRAcetam (KEPPRA XR) 500 MG 24 hr tablet Take 2 tablets (1,000 mg total) by mouth daily. 60 tablet 11   No current facility-administered medications for this visit.    PAST MEDICAL HISTORY: Past Medical History:  Diagnosis Date   History of concussion    high school while playing basketball   Seizure-like activity ([place])     PAST SURGICAL HISTORY: History reviewed. No pertinent surgical history.  FAMILY HISTORY: Family History  Problem Relation Age of Onset   Headache Mother    Lung cancer Paternal Grandfather    Hypertension Father    Colon cancer Maternal Grandfather     SOCIAL HISTORY: Social History   Socioeconomic History   Marital status: Single    Spouse name: Not on file   Number of children: 0   Years of education:  college   Highest education level: Bachelor's degree (e.g., BA, AB, BS)  Occupational History   Occupation: special needs teacher  Tobacco Use  Smoking status: Never Smoker   Smokeless tobacco: Never Used  Substance and Sexual Activity   Alcohol use: Yes    Comment: rarely   Drug use: Never   Sexual activity: Not on file  Other Topics Concern   Not on file  Social History Narrative   Lives at home alone.   Left-handed.   Occasional caffeine use.   Social Determinants of  Health   Financial Resource Strain:    Difficulty of Paying Living Expenses: Not on file  Food Insecurity:    Worried About Programme researcher, broadcasting/film/video in the Last Year: Not on file   [employer] of Food in the Last Year: Not on file  Transportation Needs:    Lack of Transportation (Medical): Not on file   Lack of Transportation (Non-Medical): Not on file  Physical Activity:    Days of Exercise per Week: Not on file   Minutes of Exercise per Session: Not on file  Stress:    Feeling of Stress : Not on file  Social Connections:    Frequency of Communication with Friends and Family: Not on file   Frequency of Social Gatherings with Friends and Family: Not on file   Attends Religious Services: Not on file   Active Member of Clubs or Organizations: Not on file   Attends Banker Meetings: Not on file   Marital Status: Not on file  Intimate Partner Violence:    Fear of Current or Ex-Partner: Not on file   Emotionally Abused: Not on file   Physically Abused: Not on file   Sexually Abused: Not on file     PHYSICAL EXAM   Vitals:   10/05/19 0710  BP: 98/63  Pulse: (!) 59  Temp: (!) 97.3 F (36.3 C)  Weight: 118 lb 8 oz (53.8 kg)  Height: 5\' 8"  (1.727 m)    Not recorded      Body mass index is 18.02 kg/m.  PHYSICAL EXAMNIATION:  Gen: NAD, conversant, well nourised,well groomed                     Cardiovascular: Regular rate rhythm, no peripheral edema, warm, nontender. Eyes: Conjunctivae clear without exudates or hemorrhage Neck: Supple, no carotid bruits. Pulmonary: Clear to auscultation bilaterally   NEUROLOGICAL EXAM:  MENTAL STATUS: Speech:    Speech is normal; fluent and spontaneous with normal comprehension.  Cognition:     Orientation to time, place and person     Normal recent and remote memory     Normal Attention span and concentration     Normal Language, naming, repeating,spontaneous speech     Fund of knowledge   CRANIAL  NERVES: CN II: Visual fields are full to confrontation. Pupils are round equal and briskly reactive to light. CN III, IV, VI: extraocular movement are normal. No ptosis. CN V: Facial sensation is intact  CN VII: Face is symmetric  CN VIII: Hearing is normal to rubbing fingers CN IX, X:  Phonation is normal. CN XI: Head turning and shoulder shrug are intact   MOTOR: There is no pronator drift of out-stretched arms. Muscle bulk and tone are normal. Muscle strength is normal.  REFLEXES: Reflexes are 2+ and symmetric at the biceps, triceps, knees, and ankles. Plantar responses are flexor.  SENSORY: Intact to light touch  COORDINATION: Rapid alternating movements and fine finger movements are intact. There is no dysmetria on finger-to-nose and heel-knee-shin.    GAIT/STANCE: Posture is normal. Gait is steady  with normal steps, base, arm swing, and turning. Heel and toe walking are normal. Tandem gait is normal.  Romberg is absent.   DIAGNOSTIC DATA (LABS, IMAGING, TESTING) - I reviewed patient records, labs, notes, testing and imaging myself where available.   ASSESSMENT AND PLAN  PIERRA SKORA is a 24 y.o. female   Passing out spells on July 12, 2018,  There was EMS described seizure-like activity,  Patient also complains of recurrent spells of staring, snapped out of it, with transient lapse of memory  MRI of the brain with and without contrast was normal  EEG was normal  Could not tolerate Keppra reported significant anxiety, she has no recurrent passing out spells,  After discussed with patient and her father, will stop AED  Call clinic for recurrent spells,  Laboratory evaluation today, continue follow-up with her primary care for treatment of mood disorder  Levert Feinstein, M.D. Ph.D.  [facility] [address], [address] [place], [place] [address] Ph: [phone] Fax: [phone]  CC: Verlon Au, MD

## 2019-10-06 LAB — CBC WITH DIFFERENTIAL
Basophils Absolute: 0 10*3/uL (ref 0.0–0.2)
Basos: 1 %
EOS (ABSOLUTE): 0.1 10*3/uL (ref 0.0–0.4)
Eos: 1 %
Hematocrit: 39.1 % (ref 34.0–46.6)
Hemoglobin: 13.5 g/dL (ref 11.1–15.9)
Immature Grans (Abs): 0 10*3/uL (ref 0.0–0.1)
Immature Granulocytes: 0 %
Lymphocytes Absolute: 1.5 10*3/uL (ref 0.7–3.1)
Lymphs: 31 %
MCH: 32.8 pg (ref 26.6–33.0)
MCHC: 34.5 g/dL (ref 31.5–35.7)
MCV: 95 fL (ref 79–97)
Monocytes Absolute: 0.7 10*3/uL (ref 0.1–0.9)
Monocytes: 14 %
Neutrophils Absolute: 2.7 10*3/uL (ref 1.4–7.0)
Neutrophils: 53 %
RBC: 4.11 x10E6/uL (ref 3.77–5.28)
RDW: 12 % (ref 11.7–15.4)
WBC: 4.9 10*3/uL (ref 3.4–10.8)

## 2019-10-06 LAB — COMPREHENSIVE METABOLIC PANEL
ALT: 9 IU/L (ref 0–32)
AST: 12 IU/L (ref 0–40)
Albumin/Globulin Ratio: 1.8 (ref 1.2–2.2)
Albumin: 4.6 g/dL (ref 3.9–5.0)
Alkaline Phosphatase: 47 IU/L (ref 39–117)
BUN/Creatinine Ratio: 11 (ref 9–23)
BUN: 11 mg/dL (ref 6–20)
Bilirubin Total: 1.3 mg/dL — ABNORMAL HIGH (ref 0.0–1.2)
CO2: 24 mmol/L (ref 20–29)
Calcium: 9.6 mg/dL (ref 8.7–10.2)
Chloride: 107 mmol/L — ABNORMAL HIGH (ref 96–106)
Creatinine, Ser: 0.99 mg/dL (ref 0.57–1.00)
GFR calc Af Amer: 92 mL/min/{1.73_m2} (ref >59)
GFR calc non Af Amer: 80 mL/min/{1.73_m2} (ref >59)
Globulin, Total: 2.6 g/dL (ref 1.5–4.5)
Glucose: 101 mg/dL — ABNORMAL HIGH (ref 65–99)
Potassium: 4.3 mmol/L (ref 3.5–5.2)
Sodium: 143 mmol/L (ref 134–144)
Total Protein: 7.2 g/dL (ref 6.0–8.5)

## 2019-10-06 LAB — TSH: TSH: 2.87 u[IU]/mL (ref 0.450–4.500)

## 2019-11-16 ENCOUNTER — Telehealth: Payer: Self-pay | Admitting: Neurology

## 2019-11-16 MED ORDER — LAMOTRIGINE 25 MG PO TABS: ORAL_TABLET | ORAL | 0 refills | Status: DC

## 2019-11-16 MED ORDER — LAMOTRIGINE 100 MG PO TABS: 100.0000 mg | ORAL_TABLET | Freq: Two times a day (BID) | ORAL | 11 refills | Status: DC

## 2019-11-16 NOTE — Telephone Encounter (Signed)
Pt's mother Jacqulyn Cane on [place] called stating that she was told to call the provider back if the pt were to have another seizure she was called by the pt's work to be informed that the pt had had a seizure at work. Please advise.

## 2019-11-16 NOTE — Telephone Encounter (Signed)
I returned the call to the patient's mother on DPR. Reports getting a phone call from her daughter's employer today (she is a Runner, broadcasting/film/video). A teacher's aid witnessed the patient having fluttering eyes and inability to verbally communicate. She went to get the school nurse who states she was likely having a seizure event. Her mother was unsure how long it lasted but said the patient had a headache afterwards.   Per vo by Dr. Terrace Arabia, she will start her on a titrating dose of lamotrigine and repeat her EEG. I reviewed the directions of lamotrigine and to call our office immediately if she develops a rash. Her mother verbalized understanding.  Additionally, I informed her mother that she should not take tub baths alone, avoid climbing heights and no driving until six months event free.  Her repeat EEG has been scheduled for 12/06/19.

## 2019-12-06 ENCOUNTER — Ambulatory Visit: Payer: PRIVATE HEALTH INSURANCE | Admitting: Neurology

## 2019-12-06 ENCOUNTER — Other Ambulatory Visit: Payer: Self-pay

## 2019-12-06 DIAGNOSIS — R410 Disorientation, unspecified: Secondary | ICD-10-CM | POA: Diagnosis not present

## 2019-12-07 NOTE — Procedures (Signed)
   HISTORY: 24 year old female, is passing out spells  TECHNIQUE:  This is a routine 16 channel EEG recording with one channel devoted to a limited EKG recording.  It was performed during wakefulness, drowsiness and asleep.  Hyperventilation and photic stimulation were performed as activating procedures.  There are minimum muscle and movement artifact noted.  Upon maximum arousal, posterior dominant waking rhythm consistent of rhythmic alpha range activity, with frequency of 9 hz. Activities are symmetric over the bilateral posterior derivations and attenuated with eye opening.  Hyperventilation produced mild/moderate buildup with higher amplitude and the slower activities noted.  Photic stimulation did not alter the tracing.  During EEG recording, patient developed drowsiness and no deeper stage of sleep was achieved. During EEG recording, there was no epileptiform discharge noted.  EKG demonstrate sinus rhythm, with heart rate of 68/min.  CONCLUSION: This is a  normal awake EEG.  There is no electrodiagnostic evidence of epileptiform discharge.  Levert Feinstein, M.D. Ph.D.  [facility] [address] [place], [place] [address] Phone: [phone] Fax:      [phone]

## 2019-12-08 ENCOUNTER — Other Ambulatory Visit: Payer: Self-pay | Admitting: Neurology

## 2019-12-11 ENCOUNTER — Telehealth: Payer: Self-pay | Admitting: Neurology

## 2019-12-11 NOTE — Telephone Encounter (Signed)
Mattie Bouch(mother on DPR)has called re: pt's lamoTRIgine (LAMICTAL) 100 MG tablet.  Mother states pt is having insomnia and has developed a small rash.  Mother is asking for a call to discuss

## 2019-12-11 NOTE — Telephone Encounter (Signed)
I failed to reach by her cell phone, could not leave message for either, I was able to talk her mother, she works as a Mudlogger, exercise regularly, yesterday, below her bra line, there was few rash,  I have advised her mother, if she developed more rash today, she should stop denies lamotrigine, if rash improved after taking a shower, dry the local area, likely related to local irritation, she can continue lamotrigine, mother reported that she has very sensitive skin  Lamotrigine does help stabilize her mood, she also complains of insomnia, I have suggested melatonin as needed

## 2019-12-11 NOTE — Telephone Encounter (Signed)
I called pt, spoke to pt's mother Mattie, per DPR. Pt started lamictal 100mg  on Saturday. On Sunday afternoon pt noticed a small rash on her chest, 7-8 "bumps" that appears similar to a heat rash. It is non painful and not changing in size or appearance.   Pt is also complaining of insomnia that started 1.5 weeks ago and she is wondering if that could be from the lamictal.

## 2019-12-15 ENCOUNTER — Telehealth: Payer: Self-pay | Admitting: Neurology

## 2019-12-15 NOTE — Telephone Encounter (Signed)
Patient's mother Jacqulyn Cane called after-hours call center regarding patient's rash.  She reports that the patient had a small rash about 5 days ago which seemed to improve but after she increased the Lamictal to 100 mg twice daily the rash has become clearly worse. It is widespread and itchy, per mom's description. Patient herself is in [place] and lives alone.  I advised mom to have the patient stop the medication and go to the ER.  Patient lives in [place] and mother lives in [place].  Patient's mom is agreeable to taking the patient to [facility] ER.  After I was able to review the phone note from 12/11/2019 I called the patient's mother back with Dr. Zannie Cove recommendation to stop the medication should the rash continue to get worse.  At this juncture we will stop the medication and the patient is going to go to the emergency room. Initially, I had advised Mom to have pt go down to 75 mg bid, but she is now advised to stop it. She was encouraged to keep me posted over the weekend.  Mom demonstrated understanding and agreement.

## 2019-12-16 ENCOUNTER — Telehealth: Payer: Self-pay | Admitting: Neurology

## 2019-12-16 NOTE — Telephone Encounter (Signed)
Patient's mother called at 9:39 AM this morning through the call service to request advice regarding seizure medication.  Patient was taken to [facility] ER last night because of a skin rash while increasing Lamictal to 100 mg twice daily.  The patient was treated with prednisone and hydroxyzine and received a prescription for p.o. of hydroxyzine which they had not yet picked up.  According to mom, patient felt better, she had still some residual itchiness and they were advised in the ER that her presentation was not in keeping with Stevens-Johnson syndrome thankfully.  The patient had stopped the Lamictal.  Patient's mom was advised to continue to have the patient monitor her symptoms and stay well-hydrated, well rested, use the hydroxyzine as needed.  Patient's mom was advised to call on Monday to request an appointment to discuss further medication management.  She was advised to have the patient stay off the Lamictal for now.  Patient's mom was in agreement with the plan.  She had no further questions.

## 2019-12-18 ENCOUNTER — Encounter: Payer: Self-pay | Admitting: *Deleted

## 2019-12-18 ENCOUNTER — Telehealth: Payer: Self-pay | Admitting: Neurology

## 2019-12-18 NOTE — Telephone Encounter (Signed)
Please check on patient, see phone note from Dr. Frances Furbish over weekend, give her a follow up visit with Sarah in 1-2 weeks

## 2019-12-18 NOTE — Telephone Encounter (Signed)
Please see other phone note for more detailed information.

## 2019-12-18 NOTE — Telephone Encounter (Signed)
Pt's mother Jacqulyn Cane on [place] called wanting to speak to RN regarding the pt's lamoTRIgine (LAMICTAL) She states she had to take the pt to the ER due to a allergic reaction to the medication. Please advise.

## 2019-12-18 NOTE — Telephone Encounter (Signed)
I spoke to her mother. States the patient still has a rash present along with itching but she is improving. She is taking Prednisone and hydroxyzine. She has been scheduled to see Margie Ege, NP on 01/08/20. 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 She wanted to wait until after that date. They will call our office with any other concerns prior to her next appt, if needed.

## 2020-01-08 ENCOUNTER — Other Ambulatory Visit: Payer: Self-pay

## 2020-01-08 ENCOUNTER — Ambulatory Visit: Payer: PRIVATE HEALTH INSURANCE | Admitting: Neurology

## 2020-01-08 ENCOUNTER — Encounter: Payer: Self-pay | Admitting: Neurology

## 2020-01-08 VITALS — BP 108/71 | HR 62 | Ht 67.0 in | Wt 124.0 lb

## 2020-01-08 DIAGNOSIS — R404 Transient alteration of awareness: Secondary | ICD-10-CM

## 2020-01-08 MED ORDER — OXCARBAZEPINE 150 MG PO TABS: 150.0000 mg | ORAL_TABLET | Freq: Two times a day (BID) | ORAL | 5 refills | Status: DC

## 2020-01-08 NOTE — Patient Instructions (Addendum)
Start trileptal 150 mg twice daily  Document all episodes  No driving until seizure free for 6 months See you back in 4 months   Oxcarbazepine tablets What is this medicine? OXCARBAZEPINE (ox car BAZ e peen) is used to treat people with epilepsy. It helps prevent partial seizures. This medicine may be used for other purposes; ask your health care provider or pharmacist if you have questions. COMMON BRAND NAME(S): Trileptal What should I tell my health care provider before I take this medicine? They need to know if you have any of these conditions:  Asian ancestry  kidney disease  liver disease  suicidal thoughts, plans, or attempt; a previous suicide attempt by you or a family member  any unusual or allergic reaction to oxcarbazepine, carbamazepine, other medicines, foods, dyes, or preservatives  pregnant or trying to get pregnant  breast-feeding How should I use this medicine? Take this medicine by mouth with a glass of water. Follow the directions on the prescription label. This medicine may be taken with or without food. Take your doses at regular intervals. Do not take your medicine more often than directed. Do not stop taking except on the advice of your doctor or health care professional. A special MedGuide will be given to you by the pharmacist with each prescription and refill. Be sure to read this information carefully each time. Talk to your pediatrician regarding the use of this medicine in children. While this medicine may be prescribed for children as young as 2 years for selected conditions, precautions do apply. Overdosage: If you think you have taken too much of this medicine contact a poison control center or emergency room at once. NOTE: This medicine is only for you. Do not share this medicine with others. What if I miss a dose? If you miss a dose, take it as soon as you can. If it is almost time for your next dose, take only that dose. Do not take double or extra  doses. What may interact with this medicine? Do not take this medicine with any of the following medications:  carbamazepine This medicine may also interact with the following medications:  birth control pills  certain medicines for seizures like phenobarbital, phenytoin, valproic acid  certain medicines for high blood pressure like felodipine, diltiazem, verapamil  cyclosporine This list may not describe all possible interactions. Give your health care provider a list of all the medicines, herbs, non-prescription drugs, or dietary supplements you use. Also tell them if you smoke, drink alcohol, or use illegal drugs. Some items may interact with your medicine. What should I watch for while using this medicine? Visit your doctor or health care provider for regular checks on your progress. Do not stop taking this medicine suddenly. This increases the risk of seizures. Wear a Arboriculturist or necklace. Carry an identification card with information about your condition, medications, and doctor or health care provider. This medicine may cause serious skin reactions. They can happen weeks to months after starting the medicine. Contact your health care provider right away if you notice fevers or flu-like symptoms with a rash. The rash may be red or purple and then turn into blisters or peeling of the skin. Or, you might notice a red rash with swelling of the face, lips or lymph nodes in your neck or under your arms. Rarely, serious skin allergic reactions may occur with this medicine. If you develop a skin rash, redness, itching, peeling skin inside your mouth, swollen glands, or a  fever while taking this medicine, contact your health care provider immediately. You may get drowsy or dizzy. Do not drive, use machinery, or do anything that needs mental alertness until you know how this drug affects you. Do not stand or sit up quickly, especially if you are an older patient. This reduces the risk of  dizzy or fainting spells. Alcohol can make you more drowsy and dizzy. Avoid alcoholic drinks. Birth control pills may not work properly while you are taking this medicine. Talk to your doctor about using an extra method of birth control. The use of this medicine may increase the chance of suicidal thoughts or actions. Pay special attention to how you are responding while on this medicine. Any worsening of mood, or thoughts of suicide or dying should be reported to your health care provider right away. Women who become pregnant while using this medicine may enroll in the [place] Pregnancy Registry by calling [id number]. This registry collects information about the safety of antiepileptic drug use during pregnancy. What side effects may I notice from receiving this medicine? Side effects that you should report to your doctor or health care professional as soon as possible:  allergic reactions such as skin rash or itching, hives, swelling of the lips, mouth, tongue, or throat  changes in vision  confusion  difficulty passing urine or change in the amount of urine  infection  nausea, vomiting  problems with balance, speaking, walking  rash, fever, and swollen lymph nodes  redness, blistering, peeling or loosening of the skin, including inside the mouth  swelling of feet, hands  unusual bleeding, bruising  unusually weak or tired  worsening of mood, thoughts or actions of suicide or dying  yellowing of eyes, skin Side effects that usually do not require medical attention (report to your doctor or health care professional if they continue or are bothersome):  constipation or diarrhea  headache  loss of appetite  nervous  stomach upset  tremors  trouble sleeping This list may not describe all possible side effects. Call your doctor for medical advice about side effects. You may report side effects to FDA at 1-800-FDA-1088. Where should I keep  my medicine? Keep out of reach of children. Store at room temperature between 15 and 30 degrees C (59 and 86 degrees F). Keep container tightly closed. Throw away any unused medicine after the expiration date. NOTE: This sheet is a summary. It may not cover all possible information. If you have questions about this medicine, talk to your doctor, pharmacist, or health care provider.  2020 Elsevier/Gold Standard (2018-10-26 10:55:30)

## 2020-01-08 NOTE — Progress Notes (Signed)
PATIENT: Dominique Carter DOB: 11-24-1995  REASON FOR VISIT: follow up HISTORY FROM: patient  HISTORY OF PRESENT ILLNESS: Today 01/08/20  HISTORY  Dominique Carter is a 24 year old female, seen in request by her primary care physician Dr. Leavy Cella, Leanora Cover, for evaluation of passing out episode, initial evaluation was on October 03, 2018.  I have reviewed and summarized the referring note from the referring physician.  She was previously healthy, working 2 jobs, is a Pension scheme manager, also coaches high school basketball.  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, she was leading a student to the resting area, she suddenly felt her head was spinning, body was warm,, vision fading away, then she fell to the ground, apparently had prolonged loss of consciousness, when she came around, she was confused, was surrounded by paramedic, she also described whole body achy pain, confusion, vision fixed to the left gaze, to go to move about, there was no tongue biting, no urinary incontinence, I reviewed emergency record, there was described seizure activity by paramedics, 3 seizures 2 minutes apart, at emergency room, neuroscience was stable, CT head without contrast.  Laboratory evaluations showed normal CBC, hemoglobin of 13.1, CMP, creatinine of 0.85,  Now she continue complains recurrent spells of transient lapse of memory, difficulty focusing, snap out of it in few seconds, each week she also complains of intermittent left frontal, right occipital area headache  UPDATE October 05 2019: She is accompanied by her father at today's clinical visit, reported patient has significant mood swing since last visit, poor appetite, sometimes crying hysterically, she did not have recurrent passing out episode, only taking Keppra xr 500 mg half tablets every night,  EEG was normal on February 01, 2019. MRI of the brain with and without contrast from [facility] on Dec 15, 2018 that was normal  She also has occasionally mild headaches, used to work as a Systems developer, and basketball coach for high school  Update January 08, 2020 SS: In March with Dr. Terrace Arabia decided to titrate off Keppra due to side effect, had recurrent episode in April, coworker witnessed fluttering of the eyes, inability to verbally communicate, had headache after, was not on any seizure medication.  EEG in May was normal.  Apparently developed diffuse skin rash on Lamictal, she stopped the medication, when switched to 100 mg tablet BID.  Rash described as itchy red bumps all over her body, went to ER, given prednisone. No further episodes, anxiety, depression improved following discontinuation of Keppra. Works as Pension scheme manager.  Here today for follow-up accompanied by her mother  REVIEW OF SYSTEMS: Out of a complete 14 system review of symptoms, the patient complains only of the following symptoms, and all other reviewed systems are negative.   Seizure  ALLERGIES: Allergies  Allergen Reactions  . Lamotrigine Rash    HOME MEDICATIONS: Outpatient Medications Prior to Visit  Medication Sig Dispense Refill  . lamoTRIgine (LAMICTAL) 100 MG tablet Take 1 tablet (100 mg total) by mouth 2 (two) times daily. 60 tablet 11  . lamoTRIgine (LAMICTAL) 25 MG tablet Take one tab BID x 1 week, then 2 tabs BID x 1 week, then 3 tab BID x 1 week, then start rx for 100mg  tablets, one BID. 84 tablet 0   No facility-administered medications prior to visit.    PAST MEDICAL HISTORY: Past Medical History:  Diagnosis Date  . History of concussion    high school while  playing basketball  . Seizure-like activity (HCC)     PAST SURGICAL HISTORY: No past surgical history on file.  FAMILY HISTORY: Family History  Problem Relation Age of Onset  . Headache Mother   . Lung cancer Paternal Grandfather   . Hypertension Father   . Colon cancer Maternal Grandfather      SOCIAL HISTORY: Social History   Socioeconomic History  . Marital status: Single    Spouse name: Not on file  . Number of children: 0  . Years of education:  college  . Highest education level: Bachelor's degree (e.g., BA, AB, BS)  Occupational History  . Occupation: special needs teacher  Tobacco Use  . Smoking status: Never Smoker  . Smokeless tobacco: Never Used  Substance and Sexual Activity  . Alcohol use: Yes    Comment: rarely  . Drug use: Never  . Sexual activity: Not on file  Other Topics Concern  . Not on file  Social History Narrative   Lives at home alone.   Left-handed.   Occasional caffeine use.   Social Determinants of Health   Financial Resource Strain:   . Difficulty of Paying Living Expenses:   Food Insecurity:   . Worried About Programme researcher, broadcasting/film/video in the Last Year:   . Barista in the Last Year:   Transportation Needs:   . Freight forwarder (Medical):   Marland Kitchen Lack of Transportation (Non-Medical):   Physical Activity:   . Days of Exercise per Week:   . Minutes of Exercise per Session:   Stress:   . Feeling of Stress :   Social Connections:   . Frequency of Communication with Friends and Family:   . Frequency of Social Gatherings with Friends and Family:   . Attends Religious Services:   . Active Member of Clubs or Organizations:   . Attends Banker Meetings:   Marland Kitchen Marital Status:   Intimate Partner Violence:   . Fear of Current or Ex-Partner:   . Emotionally Abused:   Marland Kitchen Physically Abused:   . Sexually Abused:    PHYSICAL EXAM  Vitals:   01/08/20 1439  BP: 108/71  Pulse: 62  Weight: 124 lb (56.2 kg)  Height: 5\' 7"  (1.702 m)   Body mass index is 19.42 kg/m.  Generalized: Well developed, in no acute distress   Neurological examination  Mentation: Alert oriented to time, place, history taking. Follows all commands speech and language fluent Cranial nerve II-XII: Pupils were equal round reactive to light.  Extraocular movements were full, visual field were full on confrontational test. Facial sensation and strength were normal. Head turning and shoulder shrug  were normal and symmetric. Motor: The motor testing reveals 5 over 5 strength of all 4 extremities. Good symmetric motor tone is noted throughout.  Sensory: Sensory testing is intact to soft touch on all 4 extremities. No evidence of extinction is noted.  Coordination: Cerebellar testing reveals good finger-nose-finger and heel-to-shin bilaterally.  Gait and station: Gait is normal.  Reflexes: Deep tendon reflexes are symmetric and normal bilaterally.   DIAGNOSTIC DATA (LABS, IMAGING, TESTING) - I reviewed patient records, labs, notes, testing and imaging myself where available.  Lab Results  Component Value Date   WBC 4.9 10/05/2019   HGB 13.5 10/05/2019   HCT 39.1 10/05/2019   MCV 95 10/05/2019   PLT 274 06/20/2018      Component Value Date/Time   NA 143 10/05/2019 0812   K 4.3 10/05/2019 12/05/2019  CL 107 (H) 10/05/2019 0812   CO2 24 10/05/2019 0812   GLUCOSE 101 (H) 10/05/2019 0812   GLUCOSE 107 (H) 06/20/2018 1320   BUN 11 10/05/2019 0812   CREATININE 0.99 10/05/2019 0812   CALCIUM 9.6 10/05/2019 0812   PROT 7.2 10/05/2019 0812   ALBUMIN 4.6 10/05/2019 0812   AST 12 10/05/2019 0812   ALT 9 10/05/2019 0812   ALKPHOS 47 10/05/2019 0812   BILITOT 1.3 (H) 10/05/2019 0812   GFRNONAA 80 10/05/2019 0812   GFRAA 92 10/05/2019 0812   No results found for: CHOL, HDL, LDLCALC, LDLDIRECT, TRIG, CHOLHDL No results found for: HGBA1C No results found for: VITAMINB12 Lab Results  Component Value Date   TSH 2.870 10/05/2019      ASSESSMENT AND PLAN 24 y.o. year old female  has a past medical history of History of concussion and Seizure-like activity ([place]). here with:  1.  Passing out spells on July 12, 2018, November 16, 2019 (eyes fluttering, inability to communicate, headache afterwards) -There was EMS described  seizure-like activity in December 2019 -MRI of the brain with and without contrast was normal -EEG was normal -Could not tolerate Keppra due to significant anxiety, had recurrent episode, Lamictal was started, stopped due to rash when increasing dose to 100 mg BID tablet -Discussed with Dr. Krista Blue, will start Trileptal 150 mg twice daily -Document all episodes -No driving until seizure free for 6 months -Return in 4 months or sooner if needed  I spent 30 minutes of face-to-face and non-face-to-face time with patient.  This included previsit chart review, lab review, study review, order entry, electronic health record documentation, patient education.  Butler Denmark, AGNP-C, DNP 01/08/2020, 3:09 PM [facility] [address] [place], [place] [address] [phone]

## 2020-02-14 DIAGNOSIS — R569 Unspecified convulsions: Secondary | ICD-10-CM | POA: Insufficient documentation

## 2020-04-24 NOTE — Progress Notes (Signed)
I have reviewed and agreed above plan. 

## 2020-05-09 ENCOUNTER — Ambulatory Visit: Payer: PRIVATE HEALTH INSURANCE | Admitting: Neurology

## 2020-06-26 ENCOUNTER — Other Ambulatory Visit: Payer: Self-pay | Admitting: Neurology

## 2020-10-29 ENCOUNTER — Other Ambulatory Visit: Payer: Self-pay | Admitting: Neurology

## 2020-11-25 ENCOUNTER — Other Ambulatory Visit: Payer: Self-pay | Admitting: Neurology

## 2020-12-27 ENCOUNTER — Other Ambulatory Visit: Payer: Self-pay | Admitting: Neurology

## 2021-01-01 ENCOUNTER — Ambulatory Visit: Payer: PRIVATE HEALTH INSURANCE | Admitting: Neurology

## 2021-01-02 ENCOUNTER — Encounter: Payer: Self-pay | Admitting: Neurology

## 2021-01-02 ENCOUNTER — Ambulatory Visit: Payer: BC Managed Care – PPO | Admitting: Neurology

## 2021-01-02 VITALS — BP 107/69 | HR 78 | Ht 67.0 in | Wt 124.0 lb

## 2021-01-02 DIAGNOSIS — R404 Transient alteration of awareness: Secondary | ICD-10-CM | POA: Diagnosis not present

## 2021-01-02 DIAGNOSIS — R410 Disorientation, unspecified: Secondary | ICD-10-CM

## 2021-01-02 MED ORDER — VENLAFAXINE HCL ER 37.5 MG PO CP24: 37.5000 mg | ORAL_CAPSULE | Freq: Every day | ORAL | 6 refills | Status: DC

## 2021-01-02 MED ORDER — OXCARBAZEPINE 150 MG PO TABS: 150.0000 mg | ORAL_TABLET | Freq: Two times a day (BID) | ORAL | 11 refills | Status: DC

## 2021-01-02 NOTE — Patient Instructions (Signed)
Start effexor 37.5 mg at bedtime for headache prevention, anxiety Referral to eye doctor Continue Trileptal  Will get records from [facility]  No driving until seizure free 6 months Call for any more spells See you back in 6 months

## 2021-01-02 NOTE — Progress Notes (Signed)
PATIENT: Dominique Carter DOB: Jun 30, 1996  REASON FOR VISIT: follow up HISTORY FROM: patient  HISTORY OF PRESENT ILLNESS: Today 01/02/21  HISTORY  Dominique Carter is a 25 year old female, seen in request by her primary care physician Dr. Leavy Cella, Leanora Cover, for evaluation of passing out episode, initial evaluation was on October 03, 2018.  I have reviewed and summarized the referring note from the referring physician.  She was previously healthy, working 2 jobs, is a Pension scheme manager, also coaches high school basketball.  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, she was leading a student to the resting area, she suddenly felt her head was spinning, body was warm,, vision fading away, then she fell to the ground, apparently had prolonged loss of consciousness, when she came around, she was confused, was surrounded by paramedic, she also described whole body achy pain, confusion, vision fixed to the left gaze, to go to move about, there was no tongue biting, no urinary incontinence, I reviewed emergency record, there was described seizure activity by paramedics, 3 seizures 2 minutes apart, at emergency room, neuroscience was stable, CT head without contrast.  Laboratory evaluations showed normal CBC, hemoglobin of 13.1, CMP, creatinine of 0.85,  Now she continue complains recurrent spells of transient lapse of memory, difficulty focusing, snap out of it in few seconds, each week she also complains of intermittent left frontal, right occipital area headache  UPDATE October 05 2019: She is accompanied by her father at today's clinical visit, reported patient has significant mood swing since last visit, poor appetite, sometimes crying hysterically, she did not have recurrent passing out episode, only taking Keppra xr 500 mg half tablets every night,  EEG was normal on February 01, 2019. MRI of the brain with and without contrast from [facility] on Dec 15, 2018 that was normal  She also has occasionally mild headaches, used to work as a Systems developer, and basketball coach for high school  Update January 08, 2020 SS: In March with Dr. Terrace Arabia decided to titrate off Keppra due to side effect, had recurrent episode in April, coworker witnessed fluttering of the eyes, inability to verbally communicate, had headache after, was not on any seizure medication.  EEG in May was normal.  Apparently developed diffuse skin rash on Lamictal, she stopped the medication, when switched to 100 mg tablet BID.  Rash described as itchy red bumps all over her body, went to ER, given prednisone. No further episodes, anxiety, depression improved following discontinuation of Keppra. Works as Pension scheme manager.  Here today for follow-up accompanied by her mother  Update January 02, 2021 SS: reports 1 month ago started having left eye was blurry during soft ball game, then went away, happens once a week, go away on it's own, felt like she had to blink. Didn't see an eye doctor, reported headache behind, left eye for 1 month off an on. Went to urgent care in April, for blurry vision, told to go to hospital, thought could be medication Trileptal so stopped it. Last Thursday had seizure off Trileptal, had anxiety, both legs were twitching, staring off, couldn't speak, could have been panic attack? called EMS, had headache on left side. Went to hospital, [facility] in [place], gave IV Keppra, restarted Trileptal. Reports CT head was normal.   REVIEW OF SYSTEMS: Out of a complete 14 system review of symptoms, the patient complains only of the following symptoms, and all other reviewed systems are  negative.   Seizure  ALLERGIES: Allergies  Allergen Reactions  . Lamotrigine Rash    HOME MEDICATIONS: Outpatient Medications Prior to Visit  Medication Sig Dispense Refill  . OXcarbazepine (TRILEPTAL) 150 MG tablet TAKE 1 TABLET BY MOUTH TWICE A DAY 60 tablet 1    No facility-administered medications prior to visit.    PAST MEDICAL HISTORY: Past Medical History:  Diagnosis Date  . History of concussion    high school while playing basketball  . Seizure-like activity (HCC)     PAST SURGICAL HISTORY: No past surgical history on file.  FAMILY HISTORY: Family History  Problem Relation Age of Onset  . Headache Mother   . Lung cancer Paternal Grandfather   . Hypertension Father   . Colon cancer Maternal Grandfather     SOCIAL HISTORY: Social History   Socioeconomic History  . Marital status: Single    Spouse name: Not on file  . Number of children: 0  . Years of education:  college  . Highest education level: Bachelor's degree (e.g., BA, AB, BS)  Occupational History  . Occupation: special needs teacher  Tobacco Use  . Smoking status: Never Smoker  . Smokeless tobacco: Never Used  Substance and Sexual Activity  . Alcohol use: Yes    Comment: rarely  . Drug use: Never  . Sexual activity: Not on file  Other Topics Concern  . Not on file  Social History Narrative   Lives at home alone.   Left-handed.   Occasional caffeine use.   Social Determinants of Health   Financial Resource Strain: Not on file  Food Insecurity: Not on file  Transportation Needs: Not on file  Physical Activity: Not on file  Stress: Not on file  Social Connections: Not on file  Intimate Partner Violence: Not on file   PHYSICAL EXAM  Vitals:   01/02/21 1531  BP: 107/69  Pulse: 78  Weight: 124 lb (56.2 kg)  Height: 5\' 7"  (1.702 m)   Body mass index is 19.42 kg/m.  Generalized: Well developed, in no acute distress, appears anxious Neurological examination  Mentation: Alert oriented to time, place, history taking. Follows all commands speech and language fluent Cranial nerve II-XII: Pupils were equal round reactive to light. Extraocular movements were full, visual field were full on confrontational test. Facial sensation and strength were  normal. Head turning and shoulder shrug  were normal and symmetric. Motor: The motor testing reveals 5 over 5 strength of all 4 extremities. Good symmetric motor tone is noted throughout.  Sensory: Sensory testing is intact to soft touch on all 4 extremities. No evidence of extinction is noted.  Coordination: Cerebellar testing reveals good finger-nose-finger and heel-to-shin bilaterally.  Gait and station: Gait is normal.  Tandem gait is normal.  Romberg is negative. Reflexes: Deep tendon reflexes are symmetric and normal bilaterally.   DIAGNOSTIC DATA (LABS, IMAGING, TESTING) - I reviewed patient records, labs, notes, testing and imaging myself where available.  Lab Results  Component Value Date   WBC 4.9 10/05/2019   HGB 13.5 10/05/2019   HCT 39.1 10/05/2019   MCV 95 10/05/2019   PLT 274 06/20/2018      Component Value Date/Time   NA 143 10/05/2019 0812   K 4.3 10/05/2019 0812   CL 107 (H) 10/05/2019 0812   CO2 24 10/05/2019 0812   GLUCOSE 101 (H) 10/05/2019 0812   GLUCOSE 107 (H) 06/20/2018 1320   BUN 11 10/05/2019 0812   CREATININE 0.99 10/05/2019 0812   CALCIUM  9.6 10/05/2019 0812   PROT 7.2 10/05/2019 0812   ALBUMIN 4.6 10/05/2019 0812   AST 12 10/05/2019 0812   ALT 9 10/05/2019 0812   ALKPHOS 47 10/05/2019 0812   BILITOT 1.3 (H) 10/05/2019 0812   GFRNONAA 80 10/05/2019 0812   GFRAA 92 10/05/2019 0812   No results found for: CHOL, HDL, LDLCALC, LDLDIRECT, TRIG, CHOLHDL No results found for: VAPO1I No results found for: VITAMINB12 Lab Results  Component Value Date   TSH 2.870 10/05/2019    ASSESSMENT AND PLAN 25 y.o. year old female  has a past medical history of History of concussion and Seizure-like activity (HCC). here with:  1.  Passing out spells on July 12, 2018, November 16, 2019 (eyes fluttering, inability to communicate, headache afterwards), May 26 th off Trileptal   -Will request records from ER visit at [facility]  -Continue Trileptal 150 mg  twice daily -Due to recent possible seizure spell, recommend refrain from driving until seizure-free for 86-months -Exam is normal today, question anxiety component of symptoms, could be migrainous left sided headache, will try Effexor XR 37.5 mg at bedtime -Referral to ophthalmology given intermittent left eye blurriness -Reports CT head was normal in the ER, but will obtain report -Prior MRI of the brain with and without contrast was normal -EEG was normal -Could not tolerate Keppra due to significant anxiety, had recurrent episode, Lamictal was started, stopped due to rash when increasing dose to 100 mg BID tablet -Call for any further spells, follow-up in 6 months or sooner if needed  I spent 30 minutes of face-to-face and non-face-to-face time with patient.  This included previsit chart review, lab review, study review, discussing medications, reviewing plan.  Margie Ege, AGNP-C, DNP 01/02/2021, 4:06 PM [facility] [address] [place], [place] [address] [phone]

## 2021-01-07 ENCOUNTER — Telehealth: Payer: Self-pay | Admitting: Neurology

## 2021-01-07 NOTE — Telephone Encounter (Signed)
Faxed referral to Dr. Laruth Bouchard office. They will call patient to schedule. Phone: [phone]. Fax: [phone].

## 2021-01-07 NOTE — Telephone Encounter (Signed)
Reviewed ER note Dec 26, 2020, EMS was called for reported seizure, when EMS arrived she was postictal, she had stopped Trileptal 2 weeks ago, due to concern for adverse effect of headache and vision loss.  Was given Keppra in the ER, given a oral prescription to take at discharge.  CT head showed no acute intercranial abnormality.  CBC was unremarkable, UDS was negative, CMP was hemolyzed.  Chest x-ray was unremarkable.  I called the patient, reports since taking Effexor XR at night keeping her awake, will try taking in the morning. Hasn't heard about eye doctor referral, will check on this.

## 2021-01-15 ENCOUNTER — Encounter: Payer: Self-pay | Admitting: Neurology

## 2021-01-25 ENCOUNTER — Other Ambulatory Visit: Payer: Self-pay | Admitting: Neurology

## 2021-01-29 NOTE — Telephone Encounter (Signed)
Received fax from [facility] that they have not been able to contact patient in regards to setting up appointment. I reached out to patient and let her know they have been trying to contact her, she stated she never received a call from them. I gave her the number to schedule an appointment with Dr. Dione Booze.

## 2021-04-16 NOTE — Progress Notes (Signed)
Chart reviewed, agree above plan ?

## 2021-06-28 ENCOUNTER — Other Ambulatory Visit: Payer: Self-pay | Admitting: Neurology

## 2021-07-03 ENCOUNTER — Ambulatory Visit: Payer: BC Managed Care – PPO | Admitting: Neurology

## 2021-07-03 ENCOUNTER — Encounter: Payer: Self-pay | Admitting: Neurology

## 2021-12-28 ENCOUNTER — Other Ambulatory Visit: Payer: Self-pay | Admitting: Neurology

## 2021-12-31 NOTE — Telephone Encounter (Signed)
Rx refilled.

## 2022-03-30 ENCOUNTER — Other Ambulatory Visit: Payer: Self-pay | Admitting: Neurology

## 2022-04-07 ENCOUNTER — Encounter: Payer: Self-pay | Admitting: Neurology

## 2022-04-07 ENCOUNTER — Ambulatory Visit: Payer: BC Managed Care – PPO | Admitting: Neurology

## 2022-04-07 VITALS — BP 104/63 | HR 69 | Ht 67.0 in | Wt 138.0 lb

## 2022-04-07 DIAGNOSIS — R404 Transient alteration of awareness: Secondary | ICD-10-CM | POA: Diagnosis not present

## 2022-04-07 DIAGNOSIS — R569 Unspecified convulsions: Secondary | ICD-10-CM

## 2022-04-07 MED ORDER — OXCARBAZEPINE 150 MG PO TABS: 150.0000 mg | ORAL_TABLET | Freq: Two times a day (BID) | ORAL | 4 refills | Status: DC

## 2022-04-07 MED ORDER — VENLAFAXINE HCL ER 75 MG PO CP24: 75.0000 mg | ORAL_CAPSULE | Freq: Every day | ORAL | 11 refills | Status: DC

## 2022-04-07 NOTE — Progress Notes (Signed)
PATIENT: Dominique Carter DOB: [birth year]  REASON FOR VISIT: follow up HISTORY FROM: patient PRIMARY NEUROLOGIST: Dr. Terrace Arabia   HISTORY  Dominique Carter is a 26 year old female, seen in request by her primary care physician Dr. Leavy Cella, [place] Cover, for evaluation of passing out episode, initial evaluation was on October 03, 2018.   I have reviewed and summarized the referring note from the referring physician.  She was previously healthy, working 2 jobs, is a Pension scheme manager, also coaches high school basketball.   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, she was leading a student to the resting area, she suddenly felt her head was spinning, body was warm,, vision fading away, then she fell to the ground, apparently had prolonged loss of consciousness, when she came around, she was confused, was surrounded by paramedic, she also described whole body achy pain, confusion, vision fixed to the left gaze, to go to move about, there was no tongue biting, no urinary incontinence, I reviewed emergency record, there was described seizure activity by paramedics, 3 seizures 2 minutes apart, at emergency room, neuroscience was stable, CT head without contrast.   Laboratory evaluations showed normal CBC, hemoglobin of 13.1, CMP, creatinine of 0.85,   Now she continue complains recurrent spells of transient lapse of memory, difficulty focusing, snap out of it in few seconds, each week she also complains of intermittent left frontal, right occipital area headache   UPDATE October 05 2019: She is accompanied by her father at today's clinical visit, reported patient has significant mood swing since last visit, poor appetite, sometimes crying hysterically, she did not have recurrent passing out episode, only taking Keppra xr 500 mg half tablets every night,   EEG was normal on February 01, 2019. MRI of the brain with and without contrast from [facility] on Dec 15, 2018 that was normal   She also has occasionally mild headaches, used to work as a Systems developer, and basketball coach for high school  Update January 08, 2020 SS: In March with Dr. Terrace Arabia decided to titrate off Keppra due to side effect, had recurrent episode in April, coworker witnessed fluttering of the eyes, inability to verbally communicate, had headache after, was not on any seizure medication.  EEG in May was normal.  Apparently developed diffuse skin rash on Lamictal, she stopped the medication, when switched to 100 mg tablet BID.  Rash described as itchy red bumps all over her body, went to ER, given prednisone. No further episodes, anxiety, depression improved following discontinuation of Keppra. Works as Pension scheme manager.  Here today for follow-up accompanied by her mother  Update January 02, 2021 SS: reports 1 month ago started having left eye was blurry during soft ball game, then went away, happens once a week, go away on it's own, felt like she had to blink. Didn't see an eye doctor, reported headache behind, left eye for 1 month off an on. Went to urgent care in April, for blurry vision, told to go to hospital, thought could be medication Trileptal so stopped it. Last Thursday had seizure off Trileptal, had anxiety, both legs were twitching, staring off, couldn't speak, could have been panic attack? called EMS, had headache on left side. Went to hospital, [facility] in [place], gave IV Keppra, restarted Trileptal. Reports CT head was normal.   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 Remains on Trileptal 150 mg twice daily, Effexor  XR 37.5 mg at bedtime. Claims headache nearly daily, usually when waking, takes Tylenol with good benefit. Headaches are frontal, lasts most of the day. Feels tired, some sensitivity to sound. Still anxiety. Friend describes spells of maybe once a month, notices she will have body twitch in sleep, could cluster.   REVIEW OF SYSTEMS: Out of  a complete 14 system review of symptoms, the patient complains only of the following symptoms, and all other reviewed systems are negative.   See HPI  ALLERGIES: Allergies  Allergen Reactions   Lamotrigine Rash    HOME MEDICATIONS: Outpatient Medications Prior to Visit  Medication Sig Dispense Refill   OXcarbazepine (TRILEPTAL) 150 MG tablet TAKE 1 TABLET BY MOUTH TWICE A DAY 180 tablet 0   venlafaxine XR (EFFEXOR-XR) 37.5 MG 24 hr capsule TAKE 1 CAPSULE (37.5 MG TOTAL) BY MOUTH AT BEDTIME. 90 capsule 0   No facility-administered medications prior to visit.    PAST MEDICAL HISTORY: Past Medical History:  Diagnosis Date   History of concussion    high school while playing basketball   Seizure-like activity (HCC)     PAST SURGICAL HISTORY: History reviewed. No pertinent surgical history.  FAMILY HISTORY: Family History  Problem Relation Age of Onset   Headache Mother    Lung cancer Paternal Grandfather    Hypertension Father    Colon cancer Maternal Grandfather     SOCIAL HISTORY: Social History   Socioeconomic History   Marital status: Single    Spouse name: Not on file   Number of children: 0   Years of education:  college   Highest education level: Bachelor's degree (e.g., BA, AB, BS)  Occupational History   Occupation: special needs teacher  Tobacco Use   Smoking status: Never   Smokeless tobacco: Never  Substance and Sexual Activity   Alcohol use: Yes    Comment: rarely   Drug use: Never   Sexual activity: Not on file  Other Topics Concern   Not on file  Social History Narrative   Lives at home alone.   Left-handed.   Occasional caffeine use.   Social Determinants of Health   Financial Resource Strain: Not on file  Food Insecurity: Not on file  Transportation Needs: Not on file  Physical Activity: Not on file  Stress: Not on file  Social Connections: Not on file  Intimate Partner Violence: Not on file   PHYSICAL EXAM  Vitals:   04/07/22  0741  BP: 104/63  Pulse: 69  Weight: 138 lb (62.6 kg)  Height: 5\' 7"  (1.702 m)   Body mass index is 21.61 kg/m.  Generalized: Well developed, in no acute distress, appears anxious Neurological examination  Mentation: Alert oriented to time, place, history taking. Follows all commands speech and language fluent Cranial nerve II-XII: Pupils were equal round reactive to light. Extraocular movements were full, visual field were full on confrontational test. Facial sensation and strength were normal. Head turning and shoulder shrug  were normal and symmetric. Motor: The motor testing reveals 5 over 5 strength of all 4 extremities. Good symmetric motor tone is noted throughout.  Sensory: Sensory testing is intact to soft touch on all 4 extremities. No evidence of extinction is noted.  Coordination: Cerebellar testing reveals good finger-nose-finger and heel-to-shin bilaterally.  Gait and station: Gait is normal.  Tandem gait is normal.   Reflexes: Deep tendon reflexes are symmetric and normal bilaterally.   DIAGNOSTIC DATA (LABS, IMAGING, TESTING) - I reviewed patient records, labs, notes, testing  and imaging myself where available.  Lab Results  Component Value Date   WBC 4.9 10/05/2019   HGB 13.5 10/05/2019   HCT 39.1 10/05/2019   MCV 95 10/05/2019   PLT 274 06/20/2018      Component Value Date/Time   NA 143 10/05/2019 0812   K 4.3 10/05/2019 0812   CL 107 (H) 10/05/2019 0812   CO2 24 10/05/2019 0812   GLUCOSE 101 (H) 10/05/2019 0812   GLUCOSE 107 (H) 06/20/2018 1320   BUN 11 10/05/2019 0812   CREATININE 0.99 10/05/2019 0812   CALCIUM 9.6 10/05/2019 0812   PROT 7.2 10/05/2019 0812   ALBUMIN 4.6 10/05/2019 0812   AST 12 10/05/2019 0812   ALT 9 10/05/2019 0812   ALKPHOS 47 10/05/2019 0812   BILITOT 1.3 (H) 10/05/2019 0812   GFRNONAA 80 10/05/2019 0812   GFRAA 92 10/05/2019 0812   No results found for: "CHOL", "HDL", "LDLCALC", "LDLDIRECT", "TRIG", "CHOLHDL" No results  found for: "HGBA1C" No results found for: "VITAMINB12" Lab Results  Component Value Date   TSH 2.870 10/05/2019    ASSESSMENT AND PLAN 26 y.o. year old female  has a past medical history of History of concussion and Seizure-like activity (HCC). here with:  1.  Seizure-like activity: passing out spells on July 12, 2018, November 16, 2019 (eyes fluttering, inability to communicate, headache afterwards) 2.  Frequent headache  3.  Anxiety  -Increase Effexor XR to 75 mg daily, help with mood, headache prevention -Check 24-hour ambulatory EEG, mentions spells during sleep of twitching, not clear this is seizure related; asked to monitor, take note of any trends/have friend wake her up -Check routine labs on Trileptal -Continue Trileptal 150 mg twice daily -Previously tried and failed Keppra, Lamictal -EEG x 2 normal July 2020, May 2021 -Prior MRI of the brain with and without contrast was normal -Call for seizure activity, follow-up in 6 months with Dr. Armandina Gemma, AGNP-C, DNP 04/07/2022, 8:09 AM [facility] [address], [address] [place], [place] [address] [phone]

## 2022-04-07 NOTE — Patient Instructions (Signed)
Increase Effexor XR 75 mg daily to help with mood and headaches Check 24 hour EEG  Check labs Continue Trileptal Call for seizures See you back in 6 months

## 2022-04-13 ENCOUNTER — Encounter: Payer: Self-pay | Admitting: Neurology

## 2022-04-13 LAB — CBC WITH DIFFERENTIAL/PLATELET
Basophils Absolute: 0 10*3/uL (ref 0.0–0.2)
Basos: 1 %
EOS (ABSOLUTE): 0.1 10*3/uL (ref 0.0–0.4)
Eos: 1 %
Hematocrit: 38.1 % (ref 34.0–46.6)
Hemoglobin: 12.9 g/dL (ref 11.1–15.9)
Immature Grans (Abs): 0 10*3/uL (ref 0.0–0.1)
Immature Granulocytes: 0 %
Lymphocytes Absolute: 1.5 10*3/uL (ref 0.7–3.1)
Lymphs: 31 %
MCH: 32.4 pg (ref 26.6–33.0)
MCHC: 33.9 g/dL (ref 31.5–35.7)
MCV: 96 fL (ref 79–97)
Monocytes Absolute: 0.6 10*3/uL (ref 0.1–0.9)
Monocytes: 12 %
Neutrophils Absolute: 2.7 10*3/uL (ref 1.4–7.0)
Neutrophils: 55 %
Platelets: 213 10*3/uL (ref 150–450)
RBC: 3.98 x10E6/uL (ref 3.77–5.28)
RDW: 11.9 % (ref 11.7–15.4)
WBC: 5 10*3/uL (ref 3.4–10.8)

## 2022-04-13 LAB — COMPREHENSIVE METABOLIC PANEL
ALT: 10 IU/L (ref 0–32)
AST: 14 IU/L (ref 0–40)
Albumin/Globulin Ratio: 1.7 (ref 1.2–2.2)
Albumin: 4.3 g/dL (ref 4.0–5.0)
Alkaline Phosphatase: 60 IU/L (ref 44–121)
BUN/Creatinine Ratio: 12 (ref 9–23)
BUN: 10 mg/dL (ref 6–20)
Bilirubin Total: 0.4 mg/dL (ref 0.0–1.2)
CO2: 21 mmol/L (ref 20–29)
Calcium: 9.3 mg/dL (ref 8.7–10.2)
Chloride: 105 mmol/L (ref 96–106)
Creatinine, Ser: 0.82 mg/dL (ref 0.57–1.00)
Globulin, Total: 2.5 g/dL (ref 1.5–4.5)
Glucose: 96 mg/dL (ref 70–99)
Potassium: 3.8 mmol/L (ref 3.5–5.2)
Sodium: 143 mmol/L (ref 134–144)
Total Protein: 6.8 g/dL (ref 6.0–8.5)
eGFR: 100 mL/min/{1.73_m2} (ref >59)

## 2022-04-13 LAB — 10-HYDROXYCARBAZEPINE: Oxcarbazepine SerPl-Mcnc: 4 ug/mL — ABNORMAL LOW (ref 10–35)

## 2022-04-16 ENCOUNTER — Telehealth: Payer: Self-pay

## 2022-04-16 NOTE — Telephone Encounter (Signed)
Order for ambulatory EEG has been sent via fax to [facility] for processing.

## 2022-04-29 ENCOUNTER — Other Ambulatory Visit: Payer: Self-pay | Admitting: Neurology

## 2022-10-05 ENCOUNTER — Ambulatory Visit: Payer: BC Managed Care – PPO | Admitting: Neurology

## 2022-10-05 ENCOUNTER — Encounter: Payer: Self-pay | Admitting: Neurology

## 2022-10-05 ENCOUNTER — Telehealth: Payer: Self-pay | Admitting: Neurology

## 2022-10-05 VITALS — BP 110/71 | HR 63 | Ht 67.0 in | Wt 136.5 lb

## 2022-10-05 DIAGNOSIS — R410 Disorientation, unspecified: Secondary | ICD-10-CM

## 2022-10-05 DIAGNOSIS — G4719 Other hypersomnia: Secondary | ICD-10-CM

## 2022-10-05 DIAGNOSIS — Z87898 Personal history of other specified conditions: Secondary | ICD-10-CM | POA: Insufficient documentation

## 2022-10-05 DIAGNOSIS — R404 Transient alteration of awareness: Secondary | ICD-10-CM

## 2022-10-05 DIAGNOSIS — R569 Unspecified convulsions: Secondary | ICD-10-CM

## 2022-10-05 NOTE — Telephone Encounter (Signed)
Re-ordered 72 Hours Video EEG monitoring

## 2022-10-05 NOTE — Telephone Encounter (Signed)
Form completed.

## 2022-10-05 NOTE — Progress Notes (Signed)
HISTORY  Dominique Carter is a 27 year old female, seen in request by her primary care physician Dr. Luciana Axe, [facility] Factor, for evaluation of passing out episode, initial evaluation was on October 03, 2018.   I have reviewed and summarized the referring note from the referring physician.  She was previously healthy, working 2 jobs, is a Chief Technology Officer, also coaches high school basketball.   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, she was leading a student to the resting area, she suddenly felt her head was spinning, body was warm,, vision fading away, then she fell to the ground, apparently had prolonged loss of consciousness, when she came around, she was confused, was surrounded by paramedic, she also described whole body achy pain, confusion, vision fixed to the left gaze, to go to move about, there was no tongue biting, no urinary incontinence, I reviewed emergency record, there was described seizure activity by paramedics, 3 seizures 2 minutes apart, at emergency room, neuroscience was stable, CT head without contrast.   Laboratory evaluations showed normal CBC, hemoglobin of 13.1, CMP, creatinine of 0.85,   Now she continue complains recurrent spells of transient lapse of memory, difficulty focusing, snap out of it in few seconds, each week she also complains of intermittent left frontal, right occipital area headache   UPDATE October 05 2019: She is accompanied by her father at today's clinical visit, reported patient has significant mood swing since last visit, poor appetite, sometimes crying hysterically, she did not have recurrent passing out episode, only taking Keppra xr 500 mg half tablets every night,   EEG was normal on February 01, 2019. MRI of the brain with and without contrast from [facility] on Dec 15, 2018 that was normal   She also has occasionally mild headaches, used to work as a Agricultural engineer, and basketball coach for  high school  UPDATE October 05 2022: She is accompanied by her significant other Magda Paganini at today's clinical visit, she no longer has daytime spells, today her main concern is nighttime body jerking episode, that happened during her sleep, it happened on a nightly basis, 2-4 times a day, herself did not aware of that, Magda Paganini aware a lot of that her body jerking movement, hand becomes stiff, lasting for few seconds, can happen in the period of time when she is drifting into sleep, or during sleep, patient denies tongue biting or urinary incontinence,  Magda Paganini has child with epilepsy, she is concerned that the above described spells might represent seizure [name] continues her Trileptal 150 mg twice a day, also taking Effexor 75 mg daily, mood is under better control  She complains of excessive daytime sleepiness, fatigue,   REVIEW OF SYSTEMS: Out of a complete 14 system review of symptoms, the patient complains only of the following symptoms, and all other reviewed systems are negative.   See HPI  ALLERGIES: Allergies  Allergen Reactions   Lamotrigine Rash    HOME MEDICATIONS: Outpatient Medications Prior to Visit  Medication Sig Dispense Refill   OXcarbazepine (TRILEPTAL) 150 MG tablet Take 1 tablet (150 mg total) by mouth 2 (two) times daily. 180 tablet 4   venlafaxine XR (EFFEXOR-XR) 75 MG 24 hr capsule TAKE 1 CAPSULE BY MOUTH EVERY DAY 90 capsule 4   No facility-administered medications prior to visit.    PAST MEDICAL HISTORY: Past Medical History:  Diagnosis Date   History of concussion    high school while playing basketball  Seizure-like activity ([place])     PAST SURGICAL HISTORY: History reviewed. No pertinent surgical history.  FAMILY HISTORY: Family History  Problem Relation Age of Onset   Headache Mother    Lung cancer Paternal Grandfather    Hypertension Father    Colon cancer Maternal Grandfather     SOCIAL HISTORY: Social History   Socioeconomic History    Marital status: Single    Spouse name: Not on file   Number of children: 0   Years of education:  college   Highest education level: Bachelor's degree (e.g., BA, AB, BS)  Occupational History   Occupation: special needs teacher  Tobacco Use   Smoking status: Never   Smokeless tobacco: Never  Substance and Sexual Activity   Alcohol use: Yes    Comment: rarely   Drug use: Never   Sexual activity: Not on file  Other Topics Concern   Not on file  Social History Narrative   Lives at home alone.   Left-handed.   Occasional caffeine use.   Social Determinants of Health   Financial Resource Strain: Not on file  Food Insecurity: Not on file  Transportation Needs: Not on file  Physical Activity: Not on file  Stress: Not on file  Social Connections: Not on file  Intimate Partner Violence: Not on file   PHYSICAL EXAM  Vitals:   10/05/22 0847  BP: 110/71  Pulse: 63  Weight: 136 lb 8 oz (61.9 kg)  Height: '5\' 7"'$  (1.702 m)   Body mass index is 21.38 kg/m.  Generalized: Well developed, in no acute distress, appears anxious Neurological examination  Mentation: Alert oriented to time, place, history taking. Follows all commands speech and language fluent Cranial nerve II-XII: Pupils were equal round reactive to light. Extraocular movements were full, visual field were full on confrontational test. Facial sensation and strength were normal. Head turning and shoulder shrug  were normal and symmetric.  Very small jaw, narrow oropharyngeal space Motor: The motor testing reveals 5 over 5 strength of all 4 extremities. Good symmetric motor tone is noted throughout.  Sensory: Sensory testing is intact to soft touch on all 4 extremities. No evidence of extinction is noted.  Coordination: Cerebellar testing reveals good finger-nose-finger and heel-to-shin bilaterally.  Gait and station: Gait is normal.  Tandem gait is normal.   Reflexes: Deep tendon reflexes are symmetric and normal  bilaterally.   DIAGNOSTIC DATA (LABS, IMAGING, TESTING) - I reviewed patient records, labs, notes, testing and imaging myself where available.  Lab Results  Component Value Date   WBC 5.0 04/07/2022   HGB 12.9 04/07/2022   HCT 38.1 04/07/2022   MCV 96 04/07/2022   PLT 213 04/07/2022      Component Value Date/Time   NA 143 04/07/2022 0806   K 3.8 04/07/2022 0806   CL 105 04/07/2022 0806   CO2 21 04/07/2022 0806   GLUCOSE 96 04/07/2022 0806   GLUCOSE 107 (H) 06/20/2018 1320   BUN 10 04/07/2022 0806   CREATININE 0.82 04/07/2022 0806   CALCIUM 9.3 04/07/2022 0806   PROT 6.8 04/07/2022 0806   ALBUMIN 4.3 04/07/2022 0806   AST 14 04/07/2022 0806   ALT 10 04/07/2022 0806   ALKPHOS 60 04/07/2022 0806   BILITOT 0.4 04/07/2022 0806   GFRNONAA 80 10/05/2019 0812   GFRAA 92 10/05/2019 0812   No results found for: "CHOL", "HDL", "LDLCALC", "LDLDIRECT", "TRIG", "CHOLHDL" No results found for: "HGBA1C" No results found for: "VITAMINB12" Lab Results  Component Value Date  TSH 2.870 10/05/2019    ASSESSMENT AND PLAN 27 y.o. year old female  has a past medical history of History of concussion and Seizure-like activity ([place]). here with:  1.  Seizure-like activity: passing out spells on July 12, 2018, November 16, 2019 (eyes fluttering, inability to communicate, headache afterwards) 2.  Frequent headache  3.  Anxiety  Above symptoms overall has much improved with Effexor 75 mg daily, also Trileptal 150 mg twice a day 4.  Nighttime muscle jerking movement,  Most suggestive of sleep-related chronic activity,  With frequent occurrence, concerned about possibility of seizure will proceed with 72 hours video EEG monitoring,  Patient has very small jaw, narrow oropharyngeal space, excessive daytime sleepiness and fatigue,  Will refer her to sleep study to rule out obstructive sleep apnea  Marcial Pacas, M.D. Ph.D.  [facility] [place], [place]  [address] Phone: [phone] Fax:      [phone]

## 2022-10-28 ENCOUNTER — Ambulatory Visit (INDEPENDENT_AMBULATORY_CARE_PROVIDER_SITE_OTHER): Payer: BC Managed Care – PPO | Admitting: Neurology

## 2022-10-28 ENCOUNTER — Encounter: Payer: Self-pay | Admitting: Neurology

## 2022-10-28 VITALS — BP 112/74 | HR 71 | Ht 67.0 in | Wt 138.0 lb

## 2022-10-28 DIAGNOSIS — G4719 Other hypersomnia: Secondary | ICD-10-CM | POA: Diagnosis not present

## 2022-10-28 DIAGNOSIS — G478 Other sleep disorders: Secondary | ICD-10-CM

## 2022-10-28 DIAGNOSIS — R0683 Snoring: Secondary | ICD-10-CM | POA: Diagnosis not present

## 2022-10-28 DIAGNOSIS — Z820 Family history of epilepsy and other diseases of the nervous system: Secondary | ICD-10-CM | POA: Diagnosis not present

## 2022-10-28 DIAGNOSIS — R519 Headache, unspecified: Secondary | ICD-10-CM

## 2022-10-28 DIAGNOSIS — G4763 Sleep related bruxism: Secondary | ICD-10-CM | POA: Diagnosis not present

## 2022-10-28 DIAGNOSIS — R569 Unspecified convulsions: Secondary | ICD-10-CM

## 2022-10-28 DIAGNOSIS — G253 Myoclonus: Secondary | ICD-10-CM

## 2022-10-28 NOTE — Progress Notes (Signed)
Subjective:    Patient ID: Dominique Carter is a 27 y.o. female.  HPI    Star Age, MD, PhD [facility] [address]O. [place], [place] [address]  Dear Aliene Beams,  I saw your patient, Dominique Carter, upon your kind request in my sleep clinic today for initial consultation of her sleep disorder, in particular, twitching in her sleep as well as daytime somnolence.  The patient is unaccompanied today.  As you know, Dominique Carter is a 27 year old female with an underlying medical history of history of concussion, episode of loss of consciousness, seizure-like activity, anxiety, and recurrent headaches, who reports an approximately 2 month history of excessive daytime somnolence and twitching in her sleep.  These twitches have been witnessed by her friends.  She reports that she draws up or tightens her hands and then twitches in her hands, no abnormal involuntary movements in her legs or feet.  She does not wake up from these.  She does mumble in her sleep, she is not being a sleep talker sleepwalker as a child.  She does not wake up rested.  She generally goes to bed between 9 and 10, rise time is around 6:30 AM.  She is single, she lives alone, no children, has 2 cats in the household, the cats typically do not sleep in the bedroom with her.  She does have a TV in her bedroom but it is generally not on at night.  She wakes up with headaches frequently, nearly daily, it is often a dull and achy headache, sometimes like a migraine.  She has had no recent medication changes, reports that she has been stable on her doses of Trileptal and Effexor for the past 2 years at least.  She has bruxism, does not use a bite guard.  Her dad has sleep apnea.  She snores to some degree, not sure how loud and if consistently so.  She drinks caffeine in limitation, usually 1 cup of coffee in the morning, no soda or tea.  She is a Agricultural engineer and also coaches softball.  She  has woken up with sweats at night, also within the past 2 months. She does not typically take a nap, she sleeps longer on the weekends.  Her Epworth sleepiness score is 17 out of 24, fatigue severity score is 45 out of 63. I reviewed your office note from 10/05/2022.  Her Past Medical History Is Significant For: Past Medical History:  Diagnosis Date   History of concussion    high school while playing basketball   Seizure-like activity [facility])     Her Past Surgical History Is Significant For: History reviewed. No pertinent surgical history.  Her Family History Is Significant For: Family History  Problem Relation Age of Onset   Headache Mother    Hypertension Father    Sleep apnea Father    Colon cancer Maternal Grandfather    Lung cancer Paternal Grandfather     Her Social History Is Significant For: Social History   Socioeconomic History   Marital status: Single    Spouse name: Not on file   Number of children: 0   Years of education:  college   Highest education level: Bachelor's degree (e.g., BA, AB, BS)  Occupational History   Occupation: special needs teacher  Tobacco Use   Smoking status: Never   Smokeless tobacco: Never  Substance and Sexual Activity   Alcohol use: Yes    Comment: rarely  Drug use: Never   Sexual activity: Not on file  Other Topics Concern   Not on file  Social History Narrative   Lives at home alone.   Left-handed.   Occasional caffeine use.   Social Determinants of Health   Financial Resource Strain: Not on file  Food Insecurity: Not on file  Transportation Needs: Not on file  Physical Activity: Not on file  Stress: Not on file  Social Connections: Not on file    Her Allergies Are:  Allergies  Allergen Reactions   Lamotrigine Rash  :   Her Current Medications Are:  Outpatient Encounter Medications as of 10/28/2022  Medication Sig   OXcarbazepine (TRILEPTAL) 150 MG tablet Take 1 tablet (150 mg total) by mouth 2 (two) times  daily.   venlafaxine XR (EFFEXOR-XR) 75 MG 24 hr capsule TAKE 1 CAPSULE BY MOUTH EVERY DAY   No facility-administered encounter medications on file as of 10/28/2022.  :   Review of Systems:  Out of a complete 14 point review of systems, all are reviewed and negative with the exception of these symptoms as listed below:  Review of Systems  Neurological:        Pt here for sleep consult Pt has headache,fatigue Pt denies snoring,hypertension,sleep study,CPAP     ESS:17 FSS:45    Objective:  Neurological Exam  Physical Exam Physical Examination:   Vitals:   10/28/22 0919  BP: 112/74  Pulse: 71    General Examination: The patient is a very pleasant 27 y.o. female in no acute distress. She appears well-developed and well-nourished and well groomed.   HEENT: Normocephalic, atraumatic, pupils are equal, round and reactive to light, extraocular tracking is good without limitation to gaze excursion or nystagmus noted. Hearing is grossly intact. Face is symmetric with normal facial animation. Speech is clear with no dysarthria noted. There is no hypophonia. There is no lip, neck/head, jaw or voice tremor. Neck is supple with full range of passive and active motion. There are no carotid bruits on auscultation. Oropharynx exam reveals: mild mouth dryness, good dental hygiene and mild airway crowding, due to smaller airway. Mallampati is class II. Tongue protrudes centrally and palate elevates symmetrically. Tonsils are absent. Neck size is 13.75 inches. She has a Mild overbite.   Chest: Clear to auscultation without wheezing, rhonchi or crackles noted.  Heart: S1+S2+0, regular and normal without murmurs, rubs or gallops noted.   Abdomen: Soft, non-tender and non-distended.  Extremities: There is no pitting edema in the distal lower extremities bilaterally.   Skin: Warm and dry without trophic changes noted.   Musculoskeletal: exam reveals no obvious joint deformities.    Neurologically:  Mental status: The patient is awake, alert and oriented in all 4 spheres. Her immediate and remote memory, attention, language skills and fund of knowledge are appropriate. There is no evidence of aphasia, agnosia, apraxia or anomia. Speech is clear with normal prosody and enunciation. Thought process is linear. Mood is normal and affect is normal.  Cranial nerves II - XII are as described above under HEENT exam.  Motor exam: Normal bulk, strength and tone is noted. There is no obvious action or resting tremor.  Fine motor skills and coordination: grossly intact.  Cerebellar testing: No dysmetria or intention tremor. There is no truncal or gait ataxia.  Sensory exam: intact to light touch in the upper and lower extremities.  Gait, station and balance: She stands easily. No veering to one side is noted. No leaning to one side  is noted. Posture is age-appropriate and stance is narrow based. Gait shows normal stride length and normal pace. No problems turning are noted.   Assessment and Plan:   In summary, Dominique Carter is a very pleasant 27 y.o.-year old female with an underlying medical history of history of concussion, episode of loss of consciousness, seizure-like activity, anxiety, and recurrent headaches, who presents for evaluation of her sleep disturbance including daytime somnolence, twitching in her sleep, sleep talking, symptoms have been ongoing for the past 2 months.  She has not noticed any correlation with her medications.  We can proceed with a nocturnal polysomnogram to evaluate her underlying sleep disturbance.  I had a long chat with the patient about her symptoms and my findings.  We talked about sleep apnea as well today.  We talked about the risks and ramifications of untreated moderate to severe OSA, especially with respect to developing cardiovascular disease down the road, including congestive heart failure (CHF), difficult to treat hypertension, cardiac  arrhythmias (particularly A-fib), neurovascular complications including TIA, stroke and dementia. Even type 2 diabetes has, in part, been linked to untreated OSA. Symptoms of untreated OSA may include (but may not be limited to) daytime sleepiness, nocturia (i.e. frequent nighttime urination), memory problems, mood irritability and suboptimally controlled or worsening mood disorder such as depression and/or anxiety, lack of energy, lack of motivation, physical discomfort, as well as recurrent headaches, especially morning or nocturnal headaches. We talked about the importance of maintaining a healthy lifestyle and striving for healthy weight.  In addition, we talked about the importance of striving for and maintaining good sleep hygiene. I recommended a sleep study at this time. I outlined the differences between a laboratory attended sleep study which is considered more comprehensive and accurate over the option of a home sleep test (HST); the latter may lead to underestimation of sleep disordered breathing in some instances and does not help with diagnosing upper airway resistance syndrome and is not accurate enough to diagnose primary central sleep apnea typically. I outlined possible surgical and non-surgical treatment options of OSA, including the use of a positive airway pressure (PAP) device (i.e. CPAP, AutoPAP/APAP or BiPAP in certain circumstances), a custom-made dental device (aka oral appliance, which would require a referral to a specialist dentist or orthodontist typically, and is generally speaking not considered for patients with full dentures or edentulous state), upper airway surgical options, such as traditional UPPP (which is not considered a first-line treatment) or the Inspire device (hypoglossal nerve stimulator, which would involve a referral for consultation with an ENT surgeon, after careful selection, following inclusion criteria - also not first-line treatment). I explained the PAP  treatment option to the patient in detail, as this is generally considered first-line treatment.  The patient indicated that she would be willing to try PAP therapy, if the need arises. I explained the importance of being compliant with PAP treatment, not only for insurance purposes but primarily to improve patient's symptoms symptoms, and for the patient's long term health benefit, including to reduce Her cardiovascular risks longer-term.    We will pick up our discussion about the next steps and treatment options after testing.  We will keep her posted as to the test results by phone call and/or MyChart messaging where possible.  We will plan to follow-up in sleep clinic accordingly as well.  I answered all her questions today and the patient was in agreement.   I encouraged her to call with any interim questions, concerns, problems  or updates or email [place] through [facility].  Generally speaking, sleep test authorizations may take up to 2 weeks, sometimes less, sometimes longer, the patient is encouraged to get in touch with [place] if they do not hear back from the sleep lab staff directly within the next 2 weeks.  Thank you very much for allowing me to participate in the care of this nice patient. If I can be of any further assistance to you please do not hesitate to call me at [phone].  Sincerely,   Star Age, MD, PhD

## 2022-10-28 NOTE — Patient Instructions (Signed)

## 2022-11-25 ENCOUNTER — Telehealth: Payer: Self-pay | Admitting: Neurology

## 2022-11-25 NOTE — Telephone Encounter (Signed)
[employer]- [employer] no auth req spoke to Dominique Carter ref # [id number]   Patient is scheduled at [facility] For 01/31/23 at 9 pm.  Mailed packet to the patient.

## 2022-11-26 ENCOUNTER — Encounter: Payer: Self-pay | Admitting: Neurology

## 2023-01-27 NOTE — Telephone Encounter (Signed)
I called the patient she did not pick up, I left her a voicemail informing her that I need to r/s her SS from 01/31/23 do to our tech having a family emergency. I also stated that I sent her a mychart message as well.

## 2023-04-20 NOTE — Progress Notes (Unsigned)
ASSESSMENT AND PLAN 27 y.o. year old female  has a past medical history of History of concussion and Seizure-like activity (HCC). here with:  1.  Seizure-like activity: passing out spells on July 12, 2018, November 16, 2019 (eyes fluttering, inability to communicate, headache afterwards) 2.  Frequent headache  3.  Anxiety 4.  Nighttime muscle jerking movement  -I will reorder 72-hour ambulatory video EEG for further evaluation of nighttime spells -Saw Dr. Frances Furbish for sleep consult, suggested PSG, patient had to cancel her appointment, I will reach out to sleep lab about rescheduling -Referral to psychiatry for significant stress, anxiety, better management.  For now, we will continue Effexor XR 75 mg daily -Continue Trileptal 150 mg twice daily -Check routine labs today -Recommended following up with primary care doctor -Follow-up with me in 6 months or sooner if needed  Orders Placed This Encounter  Procedures   CBC with Differential/Platelet   CMP   10-Hydroxycarbazepine   TSH   Ambulatory referral to Psychiatry    Referral Priority:   Routine    Referral Type:   Psychiatric    Referral Reason:   Specialty Services Required    Requested Specialty:   Psychiatry    Number of Visits Requested:   1   AMBULATORY EEG    Standing Status:   Future    Standing Expiration Date:   04/20/2024    Scheduling Instructions:     72 hour EEG    Order Specific Question:   Where should this test be performed    Answer:   [facility]   HISTORY  Dominique Carter is a 27 year old female, seen in request by her primary care physician Dr. Leavy Cella, [facility] Cover, for evaluation of passing out episode, initial evaluation was on October 03, 2018.   I have reviewed and summarized the referring note from the referring physician.  She was previously healthy, working 2 jobs, is a Pension scheme manager, also coaches high school basketball.   On July 12, 2018, Tuesday night at 8 PM,  while coaching basketball, she was leading a student to the resting area, she suddenly felt her head was spinning, body was warm,, vision fading away, then she fell to the ground, apparently had prolonged loss of consciousness, when she came around, she was confused, was surrounded by paramedic, she also described whole body achy pain, confusion, vision fixed to the left gaze, to go to move about, there was no tongue biting, no urinary incontinence, I reviewed emergency record, there was described seizure activity by paramedics, 3 seizures 2 minutes apart, at emergency room, neuroscience was stable, CT head without contrast.   Laboratory evaluations showed normal CBC, hemoglobin of 13.1, CMP, creatinine of 0.85,   Now she continue complains recurrent spells of transient lapse of memory, difficulty focusing, snap out of it in few seconds, each week she also complains of intermittent left frontal, right occipital area headache   UPDATE October 05 2019: She is accompanied by her father at today's clinical visit, reported patient has significant mood swing since last visit, poor appetite, sometimes crying hysterically, she did not have recurrent passing out episode, only taking Keppra xr 500 mg half tablets every night,   EEG was normal on February 01, 2019. MRI of the brain with and without contrast from [facility] on Dec 15, 2018 that was normal   She also has occasionally mild headaches, used to work as a Systems developer, and basketball coach for high school  UPDATE October 05 2022: She is accompanied by her significant other Verlon Au at today's clinical visit, she no longer has daytime spells, today her main concern is nighttime body jerking episode, that happened during her sleep, it happened on a nightly basis, 2-4 times a day, herself did not aware of that, Verlon Au aware a lot of that her body jerking movement, hand becomes stiff, lasting for few seconds, can happen in the period of time when she  is drifting into sleep, or during sleep, patient denies tongue biting or urinary incontinence,  Verlon Au has child with epilepsy, she is concerned that the above described spells might represent seizure Kaja continues her Trileptal 150 mg twice a day, also taking Effexor 75 mg daily, mood is under better control  She complains of excessive daytime sleepiness, fatigue,  Update April 21, 2023 SS: Saw Dr. Frances Furbish in March 2024, recommended sleep study, was not completed, she missed it. Ordered 72 hour EEG by Dr. Terrace Arabia. Wanted sleep study due to twitching in her sleep, daytime sleepiness, still same symptoms. Still spells at night, wrists will be locked, sore every morning, seizures? Teeth are sore in AM, no bite mark, no incontinence. No daytime spells. Never heard about 72 hours EEG. Remains on Trileptal 150 mg BID, Effexor XR 75 mg daily. Is teaching special ED in elementary, feels a lot of stress.  ESS 10.   REVIEW OF SYSTEMS: Out of a complete 14 system review of symptoms, the patient complains only of the following symptoms, and all other reviewed systems are negative.   See HPI  ALLERGIES: Allergies  Allergen Reactions   Lamotrigine Rash    HOME MEDICATIONS: Outpatient Medications Prior to Visit  Medication Sig Dispense Refill   OXcarbazepine (TRILEPTAL) 150 MG tablet Take 1 tablet (150 mg total) by mouth 2 (two) times daily. 180 tablet 4   venlafaxine XR (EFFEXOR-XR) 75 MG 24 hr capsule TAKE 1 CAPSULE BY MOUTH EVERY DAY 90 capsule 4   No facility-administered medications prior to visit.    PAST MEDICAL HISTORY: Past Medical History:  Diagnosis Date   History of concussion    high school while playing basketball   Seizure-like activity (HCC)     PAST SURGICAL HISTORY: History reviewed. No pertinent surgical history.  FAMILY HISTORY: Family History  Problem Relation Age of Onset   Headache Mother    Hypertension Father    Sleep apnea Father    Colon cancer Maternal  Grandfather    Lung cancer Paternal Grandfather     SOCIAL HISTORY: Social History   Socioeconomic History   Marital status: Single    Spouse name: Not on file   Number of children: 0   Years of education:  college   Highest education level: Bachelor's degree (e.g., BA, AB, BS)  Occupational History   Occupation: special needs teacher  Tobacco Use   Smoking status: Never   Smokeless tobacco: Never  Vaping Use   Vaping status: Never Used  Substance and Sexual Activity   Alcohol use: Yes    Alcohol/week: 1.0 standard drink of alcohol    Types: 1 Standard drinks or equivalent per week    Comment: rarely   Drug use: Never   Sexual activity: Not Currently  Other Topics Concern   Not on file  Social History Narrative   Lives at home alone.   Left-handed.   Occasional caffeine use.   Social Determinants of Health   Financial Resource Strain: Not on file  Food Insecurity: Not on  file  Transportation Needs: Not on file  Physical Activity: Not on file  Stress: Not on file  Social Connections: Not on file  Intimate Partner Violence: Not on file   PHYSICAL EXAM  Vitals:   04/21/23 0831  BP: 119/79  Pulse: (!) 107  Weight: 141 lb (64 kg)  Height: 5\' 8"  (1.727 m)   Body mass index is 21.44 kg/m.  Generalized: Well developed, in no acute distress Neurological examination  Mentation: Alert oriented to time, place, history taking. Follows all commands speech and language fluent Cranial nerve II-XII: Pupils were equal round reactive to light. Extraocular movements were full, visual field were full on confrontational test. Facial sensation and strength were normal. Head turning and shoulder shrug  were normal and symmetric.   Motor: The motor testing reveals 5 over 5 strength of all 4 extremities. Good symmetric motor tone is noted throughout.  Sensory: Sensory testing is intact to soft touch on all 4 extremities. No evidence of extinction is noted.  Coordination:  Cerebellar testing reveals good finger-nose-finger and heel-to-shin bilaterally.  Gait and station: Gait is normal.  Reflexes: Deep tendon reflexes are symmetric and normal bilaterally.   DIAGNOSTIC DATA (LABS, IMAGING, TESTING) - I reviewed patient records, labs, notes, testing and imaging myself where available.  Lab Results  Component Value Date   WBC 5.0 04/07/2022   HGB 12.9 04/07/2022   HCT 38.1 04/07/2022   MCV 96 04/07/2022   PLT 213 04/07/2022      Component Value Date/Time   NA 143 04/07/2022 0806   K 3.8 04/07/2022 0806   CL 105 04/07/2022 0806   CO2 21 04/07/2022 0806   GLUCOSE 96 04/07/2022 0806   GLUCOSE 107 (H) 06/20/2018 1320   BUN 10 04/07/2022 0806   CREATININE 0.82 04/07/2022 0806   CALCIUM 9.3 04/07/2022 0806   PROT 6.8 04/07/2022 0806   ALBUMIN 4.3 04/07/2022 0806   AST 14 04/07/2022 0806   ALT 10 04/07/2022 0806   ALKPHOS 60 04/07/2022 0806   BILITOT 0.4 04/07/2022 0806   GFRNONAA 80 10/05/2019 0812   GFRAA 92 10/05/2019 0812   No results found for: "CHOL", "HDL", "LDLCALC", "LDLDIRECT", "TRIG", "CHOLHDL" No results found for: "HGBA1C" No results found for: "VITAMINB12" Lab Results  Component Value Date   TSH 2.870 10/05/2019    Otila Kluver, DNP  [facility] [address], [address] [place], [place] [address] [phone]

## 2023-04-21 ENCOUNTER — Encounter: Payer: Self-pay | Admitting: Neurology

## 2023-04-21 ENCOUNTER — Ambulatory Visit: Payer: BC Managed Care – PPO | Admitting: Neurology

## 2023-04-21 ENCOUNTER — Telehealth: Payer: Self-pay

## 2023-04-21 ENCOUNTER — Telehealth: Payer: Self-pay | Admitting: Neurology

## 2023-04-21 VITALS — BP 119/79 | HR 107 | Ht 68.0 in | Wt 141.0 lb

## 2023-04-21 DIAGNOSIS — R569 Unspecified convulsions: Secondary | ICD-10-CM | POA: Diagnosis not present

## 2023-04-21 DIAGNOSIS — F419 Anxiety disorder, unspecified: Secondary | ICD-10-CM | POA: Diagnosis not present

## 2023-04-21 DIAGNOSIS — G4719 Other hypersomnia: Secondary | ICD-10-CM

## 2023-04-21 MED ORDER — OXCARBAZEPINE 150 MG PO TABS: 150.0000 mg | ORAL_TABLET | Freq: Two times a day (BID) | ORAL | 4 refills | Status: DC

## 2023-04-21 MED ORDER — VENLAFAXINE HCL ER 75 MG PO CP24: 75.0000 mg | ORAL_CAPSULE | Freq: Every day | ORAL | 4 refills | Status: DC

## 2023-04-21 NOTE — Telephone Encounter (Signed)
Faxed 72hr eeg study order to [facility] as requested

## 2023-04-21 NOTE — Telephone Encounter (Signed)
-----   Message from Glean Salvo sent at 04/21/2023  9:14 AM EDT ----- I ordered 72 hour ambulatory EEG. Thanks

## 2023-04-21 NOTE — Telephone Encounter (Signed)
Patient missed her PSG appointment.  Can she be contacted to reschedule? Thanks

## 2023-04-21 NOTE — Patient Instructions (Addendum)
Check labs, continue current medications, order 72 hour video EEG for further evaluation of night time spells, referral to psych to discuss anxiety/stress management, please see your primary care doctor, I will reach out to the sleep lab about scheduling sleep study,   Meds ordered this encounter  Medications   venlafaxine XR (EFFEXOR-XR) 75 MG 24 hr capsule    Sig: Take 1 capsule (75 mg total) by mouth daily.    Dispense:  90 capsule    Refill:  4   OXcarbazepine (TRILEPTAL) 150 MG tablet    Sig: Take 1 tablet (150 mg total) by mouth 2 (two) times daily.    Dispense:  180 tablet    Refill:  4    Pt needs appointment for further refills.

## 2023-04-21 NOTE — Telephone Encounter (Signed)
Sent mychart message to the patient.

## 2023-04-26 LAB — CBC WITH DIFFERENTIAL/PLATELET
Basophils Absolute: 0 10*3/uL (ref 0.0–0.2)
Basos: 1 %
EOS (ABSOLUTE): 0 10*3/uL (ref 0.0–0.4)
Eos: 1 %
Hematocrit: 40.5 % (ref 34.0–46.6)
Hemoglobin: 13.5 g/dL (ref 11.1–15.9)
Immature Grans (Abs): 0 10*3/uL (ref 0.0–0.1)
Immature Granulocytes: 0 %
Lymphocytes Absolute: 1.4 10*3/uL (ref 0.7–3.1)
Lymphs: 38 %
MCH: 31.8 pg (ref 26.6–33.0)
MCHC: 33.3 g/dL (ref 31.5–35.7)
MCV: 96 fL (ref 79–97)
Monocytes Absolute: 0.5 10*3/uL (ref 0.1–0.9)
Monocytes: 15 %
Neutrophils Absolute: 1.6 10*3/uL (ref 1.4–7.0)
Neutrophils: 45 %
Platelets: 206 10*3/uL (ref 150–450)
RBC: 4.24 x10E6/uL (ref 3.77–5.28)
RDW: 11.9 % (ref 11.7–15.4)
WBC: 3.6 10*3/uL (ref 3.4–10.8)

## 2023-04-26 LAB — COMPREHENSIVE METABOLIC PANEL
ALT: 7 IU/L (ref 0–32)
AST: 14 IU/L (ref 0–40)
Albumin: 4.3 g/dL (ref 4.0–5.0)
Alkaline Phosphatase: 67 IU/L (ref 44–121)
BUN/Creatinine Ratio: 7 — ABNORMAL LOW (ref 9–23)
BUN: 6 mg/dL (ref 6–20)
Bilirubin Total: 0.6 mg/dL (ref 0.0–1.2)
CO2: 22 mmol/L (ref 20–29)
Calcium: 9.1 mg/dL (ref 8.7–10.2)
Chloride: 105 mmol/L (ref 96–106)
Creatinine, Ser: 0.83 mg/dL (ref 0.57–1.00)
Globulin, Total: 2.3 g/dL (ref 1.5–4.5)
Glucose: 103 mg/dL — ABNORMAL HIGH (ref 70–99)
Potassium: 4.3 mmol/L (ref 3.5–5.2)
Sodium: 138 mmol/L (ref 134–144)
Total Protein: 6.6 g/dL (ref 6.0–8.5)
eGFR: 98 mL/min/{1.73_m2} (ref >59)

## 2023-04-26 LAB — 10-HYDROXYCARBAZEPINE: Oxcarbazepine SerPl-Mcnc: 7 ug/mL — ABNORMAL LOW (ref 10–35)

## 2023-04-26 LAB — TSH: TSH: 2.67 u[IU]/mL (ref 0.450–4.500)

## 2023-05-10 NOTE — Telephone Encounter (Signed)
Received fax from [facility] stating needs regular in office eeg first within the past year which was ordered. I will send to EEG [facility] to ask if the apt scheduled is for that since I am unable to tell.

## 2023-05-11 ENCOUNTER — Telehealth: Payer: Self-pay | Admitting: *Deleted

## 2023-05-11 NOTE — Telephone Encounter (Signed)
Being addressed in another encounter

## 2023-05-11 NOTE — Telephone Encounter (Signed)
Please schedule in office eeg so that she can be able to complete at home 72 hr eeg after doing that

## 2023-06-17 NOTE — Telephone Encounter (Signed)
I called the patient it went to her voicemail I left her a message stating I need to know if she has new insurance for me to check the PA process. I left my number for her to call back.

## 2023-06-21 NOTE — Telephone Encounter (Signed)
I called the patient again- she did not pick up I left her a voicemail informing her I needed her insurance information to do the prior authorization process. If I don't hear from her by tomorrow 06/22/2023 by noon I will cancel the appointment.

## 2023-06-22 NOTE — Telephone Encounter (Signed)
Patient never called me back with her up to date insurance information. I canceled her appointment like I said I would do in the voicemail I left her. Once we get her insurance information we can reschedule her Sleep study.

## 2023-06-25 ENCOUNTER — Other Ambulatory Visit: Payer: Self-pay | Admitting: Neurology

## 2023-08-19 ENCOUNTER — Encounter: Payer: Self-pay | Admitting: Neurology

## 2023-08-23 ENCOUNTER — Telehealth: Payer: Self-pay

## 2023-08-23 NOTE — Telephone Encounter (Signed)
Patient returned call and states she is trying to get into the fire [employer] and needs a letter that states her seizures are under control. I advised that Maralyn Sago has ordered an ambulatory EEG and Dr. Frances Furbish a sleep study. Patient states she didn't have insurance at the time but does now and in agreement to have EEG and sleep study completed. Advised I would send to Maralyn Sago for review. Last definite seizure per patient was 11/2022.

## 2023-08-23 NOTE — Telephone Encounter (Signed)
Call to patient, no answer. Left message to return call.

## 2023-08-24 ENCOUNTER — Telehealth: Payer: Self-pay | Admitting: Neurology

## 2023-08-24 NOTE — Telephone Encounter (Signed)
Noted, sent patient mychart message

## 2023-08-24 NOTE — Telephone Encounter (Signed)
Contacted patient via mychart about her new health insurance.

## 2023-08-31 ENCOUNTER — Ambulatory Visit: Payer: Self-pay | Admitting: Neurology

## 2023-08-31 ENCOUNTER — Telehealth: Payer: Self-pay

## 2023-08-31 ENCOUNTER — Other Ambulatory Visit: Payer: Self-pay

## 2023-08-31 DIAGNOSIS — R569 Unspecified convulsions: Secondary | ICD-10-CM

## 2023-08-31 NOTE — Addendum Note (Signed)
Addended by: Lenn Cal on: 08/31/2023 10:02 AM   Modules accepted: Orders

## 2023-08-31 NOTE — Telephone Encounter (Signed)
Pt has returned the call to April, RN. Please call pt.

## 2023-08-31 NOTE — Telephone Encounter (Signed)
Call to patient, no answer left message to return call

## 2023-08-31 NOTE — Telephone Encounter (Signed)
Patient returned call, in agreement to come at 2 pm for routine EEG. Also aware we will write letter stating in process having test completed to rule out seizures

## 2023-09-01 NOTE — Telephone Encounter (Signed)
Letter wrote and given to patient.

## 2023-09-12 ENCOUNTER — Ambulatory Visit (INDEPENDENT_AMBULATORY_CARE_PROVIDER_SITE_OTHER): Payer: Self-pay | Admitting: Neurology

## 2023-09-12 ENCOUNTER — Encounter: Payer: Self-pay | Admitting: Neurology

## 2023-09-12 DIAGNOSIS — G4719 Other hypersomnia: Secondary | ICD-10-CM

## 2023-09-12 DIAGNOSIS — G4720 Circadian rhythm sleep disorder, unspecified type: Secondary | ICD-10-CM

## 2023-09-12 DIAGNOSIS — G4763 Sleep related bruxism: Secondary | ICD-10-CM

## 2023-09-12 DIAGNOSIS — Z820 Family history of epilepsy and other diseases of the nervous system: Secondary | ICD-10-CM

## 2023-09-12 DIAGNOSIS — G253 Myoclonus: Secondary | ICD-10-CM

## 2023-09-12 DIAGNOSIS — R0683 Snoring: Secondary | ICD-10-CM

## 2023-09-12 DIAGNOSIS — G478 Other sleep disorders: Secondary | ICD-10-CM

## 2023-09-12 DIAGNOSIS — R519 Headache, unspecified: Secondary | ICD-10-CM

## 2023-09-16 NOTE — Procedures (Signed)
   HISTORY: 28 year old female presenting with seizure-like activity  TECHNIQUE:  This is a routine 16 channel EEG recording with one channel devoted to a limited EKG recording.  It was performed during wakefulness, drowsiness and asleep.  Hyperventilation and photic stimulation were performed as activating procedures.  There are minimum muscle and movement artifact noted.  Upon maximum arousal, posterior dominant waking rhythm consistent of rhythmic alpha range activity. Activities are symmetric over the bilateral posterior derivations and attenuated with eye opening.  Photic stimulation did not alter the tracing.  Hyperventilation produced mild/moderate buildup with higher amplitude and the slower activities noted.  During EEG recording, patient developed drowsiness and no deeper stage of sleep was achieved During EEG recording, there was no epileptiform discharge noted.  EKG demonstrate normal sinus rhythm.  CONCLUSION: This is a  normal awake EEG.  There is no electrodiagnostic evidence of epileptiform discharge.  Levert Feinstein, M.D. Ph.D.  [facility] [address] [place], [place] [address] Phone: [phone] Fax:      [phone]

## 2023-09-17 ENCOUNTER — Encounter: Payer: Self-pay | Admitting: Neurology

## 2023-09-20 ENCOUNTER — Encounter: Payer: Self-pay | Admitting: Neurology

## 2023-09-20 NOTE — Procedures (Signed)
 Physician Interpretation:     [facility] POLYSOMNOGRAPHY  INTERPRETATION REPORT   STUDY DATE:  09/12/2023     PATIENT NAME:  Dominique Carter         DATE OF BIRTH:  [birth year]  PATIENT ID:  [id number]    TYPE OF STUDY:  PSG  READING PHYSICIAN: True Mar, MD, PhD   SCORING TECHNICIAN: Jesusa Haddock, RPSGT     Referred by: Dr. Modena Callander ? History and Indication for Testing: 28 year old female with an underlying medical history of history of concussion, episode of loss of consciousness, seizure-like activity, anxiety, and recurrent headaches, who reports excessive daytime somnolence and twitching in her sleep. Her Epworth sleepiness score is 17 out of 24, fatigue severity score is 45 out of 63. Height: 67 in Weight: 138 lb (BMI 21) Neck Size: 14 in    MEDICATIONS: Trileptal , Effexor  XR   TECHNICAL DESCRIPTION: A registered sleep technologist was in attendance for the duration of the recording.  Data collection, scoring, video monitoring, and reporting were performed in compliance with the AASM Manual for the Scoring of Sleep and Associated Events; (Hypopnea is scored based on the criteria listed in Section VIII D. 1b in the AASM Manual V2.6 using a 4% oxygen desaturation rule or Hypopnea is scored based on the criteria listed in Section VIII D. 1a in the AASM Manual V2.6 using 3% oxygen desaturation and /or arousal rule).   SLEEP CONTINUITY AND SLEEP ARCHITECTURE:  Lights-out was at 22:14: and lights-on at  05:07:, with a total recording time of 6 hours, 52 min. Total sleep time ( TST) was 370.0 minutes with a normal sleep efficiency at 89.8%. There was  15.5% REM sleep.   BODY POSITION:  TST was divided  between the following sleep positions: 26.2% supine;  73.8% lateral;  0% prone. Duration of total sleep and percent of total sleep in their respective position is as follows: supine 97 minutes (26%), non-supine 273 minutes (74%); right 173 minutes (47%),  left 99 minutes (27%), and prone 00 minutes (0%).  Total supine REM sleep time was 14 minutes (24% of total REM sleep).  Sleep latency was normal at 25.5 minutes.  REM sleep latency was normal at 89.0 minutes. Of the total sleep time, the percentage of stage N1 sleep was 0.8%, stage N2 sleep was 52%, which is normal, stage N3 sleep was 31.2%, which is increased, and REM sleep was 15.5%, which is mildly reduced.  Wake after sleep onset (WASO) time accounted for 16.5 minutes with minimal intermittent sleep fragmentation noted.   RESPIRATORY MONITORING:   Based on CMS criteria (using a 4% oxygen desaturation rule for scoring hypopneas), there were 0 apneas (0 obstructive; 0 central; 0 mixed), and 1 hypopneas.  Apnea index was 0.0. Hypopnea index was 0.2. The apnea-hypopnea index was 0.2 overall (0.6 supine, 0 non-supine; 0.0 REM, 0.0 supine REM).  There were 0 respiratory effort-related arousals (RERAs).  The RERA index was 0 events/h. Total respiratory disturbance index (RDI) was 0.2 events/h. RDI results showed: supine RDI  0.6 /h; non-supine RDI 0.0 /h; REM RDI 0.0 /h, supine REM RDI 0.0 /h.   Based on AASM criteria (using a 3% oxygen desaturation and /or arousal rule for scoring hypopneas), there were 0 apneas (0 obstructive; 0 central; 0 mixed), and 2 hypopneas. Apnea index was 0.0. Hypopnea index was 0.3. The apnea-hypopnea index was 0.3/hour overall (1.2 supine, 0 non-supine; 0.0 REM, 0.0 supine REM).  There were 0 respiratory effort-related  arousals (RERAs).  The RERA index was 0 events/h. Total respiratory disturbance index (RDI) was 0.3 events/h. RDI results showed: supine RDI  1.2 /h; non-supine RDI 0.0 /h; REM RDI 0.0 /h, supine REM RDI 0.0 /h.    OXIMETRY: Oxyhemoglobin Saturation Nadir during sleep was at  94% from a mean of 97%.  Of the Total sleep time (TST)   hypoxemia (=<88%) was present for  0.0 minutes, or 0.0% of total sleep time.   LIMB MOVEMENTS: There were 0 periodic limb movements  of sleep (0.0/hr), of which 0 (0.0/hr) were associated with an arousal.   AROUSAL: There were 73 arousals in total, for an arousal index of 12 arousals/hour.  Of these, 2 were identified as respiratory-related arousals (0 /h), 0 were PLM-related arousals (0 /h), and 87 were non-specific arousals (14 /h).    EEG: Review of the EEG showed no abnormal electrical discharges and symmetrical bihemispheric findings.    EKG: The EKG revealed normal sinus rhythm (NSR). The average heart rate during sleep was 81 bpm.   AUDIO/VIDEO REVIEW: The audio and video review did not show any abnormal or unusual behaviors, movements, phonations or vocalizations. The patient took no restroom breaks. No significant or audible snoring was noted.  POST-STUDY QUESTIONNAIRE: Post study, the patient indicated, that sleep was the same as usual.   IMPRESSION:    1. Dysfunctions associated with sleep stages or arousal from sleep   RECOMMENDATIONS:    1. This study does not demonstrate any significant obstructive or central sleep disordered breathing with an AHI of less than 5/hour - her AHI was 0.3/hour - and oxygen saturations were at or above 94% for the night. No significant snoring was noted. Treatment with a positive airway pressure device, such as CPAP or autoPAP is not indicated.  2. This study shows minimal sleep fragmentation and mildly abnormal sleep stage percentages; these are nonspecific findings and per se do not signify an intrinsic sleep disorder or a cause for the patient's sleep-related symptoms. Causes include (but are not limited to) the first night effect of the sleep study, circadian rhythm disturbances, medication effect or an underlying mood disorder or medical problem. No abnormal movements or twitching were noted. 3. The patient should be cautioned not to drive, work at heights, or operate dangerous or heavy equipment when tired or sleepy. Review and reiteration of good sleep hygiene measures should  be pursued with any patient. 4. The patient will be advised to follow up with the referring provider, who will be notified of the test results.   I certify that I have reviewed the entire raw data recording prior to the issuance of this report in accordance with the Standards of Accreditation of the American Academy of Sleep Medicine (AASM).  True Mar, MD, PhD Medical Director, [facility]) Diplomat, ABPN (Neurology and Sleep)              Technical Report:   General Information  Name: Nasrin, Lanzo BMI: 21.61 Physician: True Mar, MD  ID: [id number] Height: 67.0 in Technician: Jesusa Haddock, RPSGT  Sex: Female Weight: 138.0 lb Record: [id number]  Age: 27 [[birth year]] Date: 09/12/2023    Medical & Medication History    28 year old female with an underlying medical history of history of concussion, episode of loss of consciousness, seizure-like activity, anxiety, and recurrent headaches, who reports an approximately 2 month history of excessive daytime somnolence and twitching in her sleep. These twitches have been witnessed by her friends.  She reports that she draws up or tightens her hands and then twitches in her hands, no abnormal involuntary movements in her legs or feet. She does not wake up from these. She does mumble in her sleep, she is not being a sleep talker sleepwalker as a child. She does not wake up rested. She wakes up with headaches frequently, nearly daily, it is often a dull and achy headache, sometimes like a migraine. Trileptal , Effexor  XR   Sleep Disorder      Comments   The patient came into the sleep lab for a PSG. No restroom breaks. EKG did not show any obvious cardiac arrhythmias. No snoring. All sleep stages witnessed. Respiratory events scored with a 3% desat. The patient slept supine and lateral. AHI wa 0.5 after 2 hrs of TST. No twitching observed. No bruxism. Pt didn't wear bite guard.     Lights out:  10:14:51 PM Lights on: 05:07:03 AM   Time Total Supine Side Prone Upright  Recording (TRT) 6h 52.66m 1h 42.6m 5h 10.54m 0h 0.66m 0h 0.59m  Sleep (TST) 6h 10.81m 1h 37.38m 4h 33.40m 0h 0.69m 0h 0.73m   Latency N1 N2 N3 REM Onset Per. Slp. Eff.  Actual 0h 0.8m 0h 3.70m 0h 15.59m 1h 29.79m 0h 25.16m 0h 27.20m 89.81%   Stg Dur Wake N1 N2 N3 REM  Total 42.0 3.0 194.0 115.5 57.5  Supine 5.0 0.0 54.0 29.0 14.0  Side 37.0 3.0 140.0 86.5 43.5  Prone 0.0 0.0 0.0 0.0 0.0  Upright 0.0 0.0 0.0 0.0 0.0   Stg % Wake N1 N2 N3 REM  Total 10.2 0.8 52.4 31.2 15.5  Supine 1.2 0.0 14.6 7.8 3.8  Side 9.0 0.8 37.8 23.4 11.8  Prone 0.0 0.0 0.0 0.0 0.0  Upright 0.0 0.0 0.0 0.0 0.0     Apnea Summary Sub Supine Side Prone Upright  Total 0 Total 0 0 0 0 0    REM 0 0 0 0 0    NREM 0 0 0 0 0  Obs 0 REM 0 0 0 0 0    NREM 0 0 0 0 0  Mix 0 REM 0 0 0 0 0    NREM 0 0 0 0 0  Cen 0 REM 0 0 0 0 0    NREM 0 0 0 0 0   Rera Summary Sub Supine Side Prone Upright  Total 0 Total 0 0 0 0 0    REM 0 0 0 0 0    NREM 0 0 0 0 0   Hypopnea Summary Sub Supine Side Prone Upright  Total 2 Total 2 2 0 0 0    REM 0 0 0 0 0    NREM 2 2 0 0 0   4% Hypopnea Summary Sub Supine Side Prone Upright  Total (4%) 1 Total 1 1 0 0 0    REM 0 0 0 0 0    NREM 1 1 0 0 0     AHI Total Obs Mix Cen  0.32 Apnea 0.00 0.00 0.00 0.00   Hypopnea 0.32 -- -- --  0.16 Hypopnea (4%) 0.16 -- -- --    Total Supine Side Prone Upright  Position AHI 0.32 1.24 0.00 0.00 0.00  REM AHI 0.00   NREM AHI 0.38   Position RDI 0.32 1.24 0.00 0.00 0.00  REM RDI 0.00   NREM RDI 0.38    4% Hypopnea Total Supine Side Prone Upright  Position AHI (4%) 0.16  0.62 0.00 0.00 0.00  REM AHI (4%) 0.00   NREM AHI (4%) 0.19   Position RDI (4%) 0.16 0.62 0.00 0.00 0.00  REM RDI (4%) 0.00   NREM RDI (4%) 0.19    Desaturation Information Threshold: 2% <100% <90% <80% <70% <60% <50% <40%  Supine 13.0 0.0 0.0 0.0 0.0 0.0 0.0  Side 16.0 0.0 0.0 0.0 0.0 0.0 0.0  Prone 0.0  0.0 0.0 0.0 0.0 0.0 0.0  Upright 0.0 0.0 0.0 0.0 0.0 0.0 0.0  Total 29.0 0.0 0.0 0.0 0.0 0.0 0.0  Index 4.5 0.0 0.0 0.0 0.0 0.0 0.0   Threshold: 3% <100% <90% <80% <70% <60% <50% <40%  Supine 4.0 0.0 0.0 0.0 0.0 0.0 0.0  Side 3.0 0.0 0.0 0.0 0.0 0.0 0.0  Prone 0.0 0.0 0.0 0.0 0.0 0.0 0.0  Upright 0.0 0.0 0.0 0.0 0.0 0.0 0.0  Total 7.0 0.0 0.0 0.0 0.0 0.0 0.0  Index 1.1 0.0 0.0 0.0 0.0 0.0 0.0   Threshold: 4% <100% <90% <80% <70% <60% <50% <40%  Supine 2.0 0.0 0.0 0.0 0.0 0.0 0.0  Side 0.0 0.0 0.0 0.0 0.0 0.0 0.0  Prone 0.0 0.0 0.0 0.0 0.0 0.0 0.0  Upright 0.0 0.0 0.0 0.0 0.0 0.0 0.0  Total 2.0 0.0 0.0 0.0 0.0 0.0 0.0  Index 0.3 0.0 0.0 0.0 0.0 0.0 0.0   Threshold: 3% <100% <90% <80% <70% <60% <50% <40%  Supine 4 0 0 0 0 0 0  Side 3 0 0 0 0 0 0  Prone 0 0 0 0 0 0 0  Upright 0 0 0 0 0 0 0  Total 7 0 0 0 0 0 0   Awakening/Arousal Information # of Awakenings 21  Wake after sleep onset 16.13m  Wake after persistent sleep 16.1m   Arousal Assoc. Arousals Index  Apneas 0 0.0  Hypopneas 2 0.3  Leg Movements 0 0.0  Snore 0 0.0  PTT Arousals 0 0.0  Spontaneous 87 14.1  Total 89 14.4  Leg Movement Information PLMS LMs Index  Total LMs during PLMS 0 0.0  LMs w/ Microarousals 0 0.0   LM LMs Index  w/ Microarousal 0 0.0  w/ Awakening 0 0.0  w/ Resp Event 0 0.0  Spontaneous 2 0.3  Total 2 0.3     Desaturation threshold setting: 3% Minimum desaturation setting: 10 seconds SaO2 nadir: 93% The longest event was a 28 sec obstructive Hypopnea with a minimum SaO2 of 93%. The lowest SaO2 was 93% associated with a 28 sec obstructive Hypopnea. EKG Rates EKG Avg Max Min  Awake 79 98 68  Asleep 81 102 66  EKG Events: Tachycardia

## 2023-11-30 ENCOUNTER — Telehealth: Payer: Self-pay | Admitting: Neurology

## 2023-11-30 NOTE — Telephone Encounter (Signed)
 Appointment details confirmed

## 2023-12-01 ENCOUNTER — Ambulatory Visit (INDEPENDENT_AMBULATORY_CARE_PROVIDER_SITE_OTHER): Payer: BC Managed Care – PPO | Admitting: Neurology

## 2023-12-01 ENCOUNTER — Encounter: Payer: Self-pay | Admitting: Neurology

## 2023-12-01 VITALS — BP 117/75 | HR 78 | Ht 67.0 in | Wt 137.0 lb

## 2023-12-01 DIAGNOSIS — R569 Unspecified convulsions: Secondary | ICD-10-CM

## 2023-12-01 DIAGNOSIS — F419 Anxiety disorder, unspecified: Secondary | ICD-10-CM

## 2023-12-01 MED ORDER — OXCARBAZEPINE 150 MG PO TABS: 150.0000 mg | ORAL_TABLET | Freq: Two times a day (BID) | ORAL | 2 refills | Status: DC

## 2023-12-01 MED ORDER — VENLAFAXINE HCL ER 75 MG PO CP24: 75.0000 mg | ORAL_CAPSULE | Freq: Every day | ORAL | 1 refills | Status: DC

## 2023-12-01 NOTE — Patient Instructions (Signed)
 Check labs today.  Continue current medications.  Please call for any seizures.  Follow-up in 6 months.  Thanks!!

## 2023-12-01 NOTE — Progress Notes (Signed)
 ASSESSMENT AND PLAN 28 y.o. year old female  has a past medical history of History of concussion and Seizure-like activity (HCC). here with:  1.  Seizure-like activity: passing out spells on July 12, 2018, November 16, 2019 (eyes fluttering, inability to communicate, headache afterwards) 2.  Frequent headache  3.  Anxiety 4.  Nighttime muscle jerking movement, resolved  - Reports doing very well, denies any abnormal nighttime muscle jerking movements since October 2024, no other seizure like spells reported  - Continue Trileptal  150 mg twice a day for seizure prevention - Continue Effexor  XR 75 mg daily, helpful for anxiety, headaches - Check routine labs today - Call for seizure activity, follow-up virtually in 6 months for check-in  - Routine EEG in January 2025 was normal  - PSG February 2025 showed minimal sleep fragmentation, no apnea, no intrinsic sleep disorder identified  Orders Placed This Encounter  Procedures   10-Hydroxycarbazepine   CMP   HISTORY  Dominique Carter is a 28 year old female, seen in request by her primary care physician Dr. Suszanne Eriksson, Donnetta Gains, for evaluation of passing out episode, initial evaluation was on October 03, 2018.   I have reviewed and summarized the referring note from the referring physician.  She was previously healthy, working 2 jobs, is a Pension scheme manager, also coaches high school basketball.   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, she was leading a student to the resting area, she suddenly felt her head was spinning, body was warm,, vision fading away, then she fell to the ground, apparently had prolonged loss of consciousness, when she came around, she was confused, was surrounded by paramedic, she also described whole body achy pain, confusion, vision fixed to the left gaze, to go to move about, there was no tongue biting, no urinary incontinence, I reviewed emergency record, there was described  seizure activity by paramedics, 3 seizures 2 minutes apart, at emergency room, neuroscience was stable, CT head without contrast.   Laboratory evaluations showed normal CBC, hemoglobin of 13.1, CMP, creatinine of 0.85,   Now she continue complains recurrent spells of transient lapse of memory, difficulty focusing, snap out of it in few seconds, each week she also complains of intermittent left frontal, right occipital area headache   UPDATE October 05 2019: She is accompanied by her father at today's clinical visit, reported Dominique Carter has significant mood swing since last visit, poor appetite, sometimes crying hysterically, she did not have recurrent passing out episode, only taking Keppra  xr 500 mg half tablets every night,   EEG was normal on February 01, 2019. MRI of the brain with and without contrast from [facility] on Dec 15, 2018 that was normal   She also has occasionally mild headaches, used to work as a Systems developer, and basketball coach for high school  UPDATE October 05 2022: She is accompanied by her significant other Gurney Lefort at today's clinical visit, she no longer has daytime spells, today her main concern is nighttime body jerking episode, that happened during her sleep, it happened on a nightly basis, 2-4 times a day, herself did not aware of that, Gurney Lefort aware a lot of that her body jerking movement, hand becomes stiff, lasting for few seconds, can happen in the period of time when she is drifting into sleep, or during sleep, Dominique Carter denies tongue biting or urinary incontinence,  Gurney Lefort has child with epilepsy, she is concerned that the above described spells might represent seizure Dominique Carter  continues her Trileptal  150 mg twice a day, also taking Effexor  75 mg daily, mood is under better control  She complains of excessive daytime sleepiness, fatigue,  Update April 21, 2023 SS: Saw Dr. Omar Bibber in March 2024, recommended sleep study, was not completed, she missed it.  Ordered 72 hour EEG by Dr. Gracie Lav. Wanted sleep study due to twitching in her sleep, daytime sleepiness, still same symptoms. Still spells at night, wrists will be locked, sore every morning, seizures? Teeth are sore in AM, no bite mark, no incontinence. No daytime spells. Never heard about 72 hours EEG. Remains on Trileptal  150 mg BID, Effexor  XR 75 mg daily. Is teaching special ED in elementary, feels a lot of stress.  ESS 10.  Update 12/01/23 SS: Routine EEG was normal Jan 2025. PSG was unrevealing, minimal sleep fragmentation, no abnormal movement or twitching, no apneas. Reports unusual sleep spells have resolved, sleeping well, thinks prior due to stress from work (spells stopped around October 2024). No seizures. Remains on Trileptal  150 mg BID. Right now working on [employer], trying to go into Dietitian. No insurance right now.    REVIEW OF SYSTEMS: Out of a complete 14 system review of symptoms, the Dominique Carter complains only of the following symptoms, and all other reviewed systems are negative.   See HPI  ALLERGIES: Allergies  Allergen Reactions   Lamotrigine  Rash    HOME MEDICATIONS: Outpatient Medications Prior to Visit  Medication Sig Dispense Refill   OXcarbazepine  (TRILEPTAL ) 150 MG tablet TAKE 1 TABLET BY MOUTH TWICE A DAY 180 tablet 2   venlafaxine  XR (EFFEXOR -XR) 75 MG 24 hr capsule Take 1 capsule (75 mg total) by mouth daily. 90 capsule 4   No facility-administered medications prior to visit.    PAST MEDICAL HISTORY: Past Medical History:  Diagnosis Date   History of concussion    high school while playing basketball   Seizure-like activity (HCC)     PAST SURGICAL HISTORY: History reviewed. No pertinent surgical history.  FAMILY HISTORY: Family History  Problem Relation Age of Onset   Headache Mother    Hypertension Father    Sleep apnea Father    Colon cancer Maternal Grandfather    Lung cancer Paternal Grandfather     SOCIAL HISTORY: Social History    Socioeconomic History   Marital status: Single    Spouse name: Not on file   Number of children: 0   Years of education:  college   Highest education level: Bachelor's degree (e.g., BA, AB, BS)  Occupational History   Occupation: special needs teacher  Tobacco Use   Smoking status: Never   Smokeless tobacco: Never  Vaping Use   Vaping status: Never Used  Substance and Sexual Activity   Alcohol use: Yes    Alcohol/week: 1.0 standard drink of alcohol    Types: 1 Standard drinks or equivalent per week    Comment: rarely   Drug use: Never   Sexual activity: Not Currently  Other Topics Concern   Not on file  Social History Narrative   Lives at home alone.   Left-handed.   Occasional caffeine use.   Social Drivers of Corporate investment banker Strain: Not on file  Food Insecurity: Not on file  Transportation Needs: Not on file  Physical Activity: Not on file  Stress: Not on file  Social Connections: Not on file  Intimate Partner Violence: Not on file   PHYSICAL EXAM  Vitals:   12/01/23 0822  BP: 117/75  Pulse: 78  Weight: 137 lb (62.1 kg)  Height: 5\' 7"  (1.702 m)   Body mass index is 21.46 kg/m.  Generalized: Well developed, in no acute distress Neurological examination  Mentation: Alert oriented to time, place, history taking. Follows all commands speech and language fluent Cranial nerve II-XII: Pupils were equal round reactive to light. Extraocular movements were full, visual field were full on confrontational test. Facial sensation and strength were normal. Head turning and shoulder shrug  were normal and symmetric.   Motor: The motor testing reveals 5 over 5 strength of all 4 extremities. Good symmetric motor tone is noted throughout.  Sensory: Sensory testing is intact to soft touch on all 4 extremities. No evidence of extinction is noted.  Coordination: Cerebellar testing reveals good finger-nose-finger and heel-to-shin bilaterally.  Gait and station: Gait  is normal.  Reflexes: Deep tendon reflexes are symmetric and normal bilaterally.   DIAGNOSTIC DATA (LABS, IMAGING, TESTING) - I reviewed Dominique Carter records, labs, notes, testing and imaging myself where available.  Lab Results  Component Value Date   WBC 3.6 04/21/2023   HGB 13.5 04/21/2023   HCT 40.5 04/21/2023   MCV 96 04/21/2023   PLT 206 04/21/2023      Component Value Date/Time   NA 138 04/21/2023 0907   K 4.3 04/21/2023 0907   CL 105 04/21/2023 0907   CO2 22 04/21/2023 0907   GLUCOSE 103 (H) 04/21/2023 0907   GLUCOSE 107 (H) 06/20/2018 1320   BUN 6 04/21/2023 0907   CREATININE 0.83 04/21/2023 0907   CALCIUM 9.1 04/21/2023 0907   PROT 6.6 04/21/2023 0907   ALBUMIN 4.3 04/21/2023 0907   AST 14 04/21/2023 0907   ALT 7 04/21/2023 0907   ALKPHOS 67 04/21/2023 0907   BILITOT 0.6 04/21/2023 0907   GFRNONAA 80 10/05/2019 0812   GFRAA 92 10/05/2019 0812   No results found for: "CHOL", "HDL", "LDLCALC", "LDLDIRECT", "TRIG", "CHOLHDL" No results found for: "HGBA1C" No results found for: "VITAMINB12" Lab Results  Component Value Date   TSH 2.670 04/21/2023    Cortland Ding, DNP  [facility] [address] [place], [place] [address] [phone]

## 2023-12-02 NOTE — Progress Notes (Signed)
 Chart reviewed, agree above plan ?

## 2023-12-03 LAB — COMPREHENSIVE METABOLIC PANEL WITH GFR
ALT: 9 IU/L (ref 0–32)
AST: 13 IU/L (ref 0–40)
Albumin: 4.3 g/dL (ref 4.0–5.0)
Alkaline Phosphatase: 76 IU/L (ref 44–121)
BUN/Creatinine Ratio: 11 (ref 9–23)
BUN: 10 mg/dL (ref 6–20)
Bilirubin Total: 0.6 mg/dL (ref 0.0–1.2)
CO2: 24 mmol/L (ref 20–29)
Calcium: 9.2 mg/dL (ref 8.7–10.2)
Chloride: 103 mmol/L (ref 96–106)
Creatinine, Ser: 0.9 mg/dL (ref 0.57–1.00)
Globulin, Total: 2.1 g/dL (ref 1.5–4.5)
Glucose: 103 mg/dL — ABNORMAL HIGH (ref 70–99)
Potassium: 4.1 mmol/L (ref 3.5–5.2)
Sodium: 138 mmol/L (ref 134–144)
Total Protein: 6.4 g/dL (ref 6.0–8.5)
eGFR: 89 mL/min/{1.73_m2} (ref >59)

## 2023-12-03 LAB — OXCARBAZEPINE (TRILEPTAL), SERUM: Oxcarbazepine SerPl-Mcnc: 6 ug/mL — ABNORMAL LOW (ref 10–35)

## 2023-12-06 ENCOUNTER — Encounter: Payer: Self-pay | Admitting: Neurology

## 2024-07-19 ENCOUNTER — Telehealth: Payer: PRIVATE HEALTH INSURANCE | Admitting: Neurology

## 2024-07-19 DIAGNOSIS — R519 Headache, unspecified: Secondary | ICD-10-CM

## 2024-07-19 DIAGNOSIS — F419 Anxiety disorder, unspecified: Secondary | ICD-10-CM | POA: Diagnosis not present

## 2024-07-19 DIAGNOSIS — R569 Unspecified convulsions: Secondary | ICD-10-CM | POA: Diagnosis not present

## 2024-07-19 MED ORDER — VENLAFAXINE HCL ER 75 MG PO CP24: 75.0000 mg | ORAL_CAPSULE | Freq: Every day | ORAL | 1 refills | Status: AC

## 2024-07-19 MED ORDER — OXCARBAZEPINE 150 MG PO TABS: 150.0000 mg | ORAL_TABLET | Freq: Two times a day (BID) | ORAL | 2 refills | Status: AC

## 2024-07-19 NOTE — Progress Notes (Signed)
 Virtual Visit via Video Note  I connected with Dominique Carter on 07/19/2024 at  1:15 PM EST by a video enabled telemedicine application and verified that I am speaking with the correct person using two identifiers.  Location: Patient: at her home Provider: in the office    I discussed the limitations of evaluation and management by telemedicine and the availability of in person appointments. The patient expressed understanding and agreed to proceed.  ASSESSMENT AND PLAN 28 y.o. year old female  has a past medical history of History of concussion and Seizure-like activity (HCC). here with:  1.  Seizure-like activity: passing out spells on July 12, 2018, November 16, 2019 (eyes fluttering, inability to communicate, headache afterwards) 2.  Frequent headache  3.  Anxiety 4.  Nighttime muscle jerking movement, resolved  - Reports doing very well, denies any abnormal nighttime muscle jerking movements since October 2024, no other seizure like spells reported  - Continue Trileptal  150 mg twice a day for seizure prevention - Continue Effexor  XR 75 mg daily, helpful for anxiety, headaches - Call for seizure activity, follow-up virtually in 6 months  - Routine EEG in January 2025 was normal  - PSG February 2025 showed minimal sleep fragmentation, no apnea, no intrinsic sleep disorder identified   HISTORY  Dominique Carter is a 28 year old female, seen in request by her primary care physician Dr. Jolee, Madelin Patch, for evaluation of passing out episode, initial evaluation was on October 03, 2018.   I have reviewed and summarized the referring note from the referring physician.  She was previously healthy, working 2 jobs, is a pension scheme manager, also coaches high school basketball.   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, she was leading a student to the resting area, she suddenly felt her head was spinning, body was warm,, vision fading away,  then she fell to the ground, apparently had prolonged loss of consciousness, when she came around, she was confused, was surrounded by paramedic, she also described whole body achy pain, confusion, vision fixed to the left gaze, to go to move about, there was no tongue biting, no urinary incontinence, I reviewed emergency record, there was described seizure activity by paramedics, 3 seizures 2 minutes apart, at emergency room, neuroscience was stable, CT head without contrast.   Laboratory evaluations showed normal CBC, hemoglobin of 13.1, CMP, creatinine of 0.85,   Now she continue complains recurrent spells of transient lapse of memory, difficulty focusing, snap out of it in few seconds, each week she also complains of intermittent left frontal, right occipital area headache   UPDATE October 05 2019: She is accompanied by her father at today's clinical visit, reported patient has significant mood swing since last visit, poor appetite, sometimes crying hysterically, she did not have recurrent passing out episode, only taking Keppra  xr 500 mg half tablets every night,   EEG was normal on February 01, 2019. MRI of the brain with and without contrast from [facility] on Dec 15, 2018 that was normal   She also has occasionally mild headaches, used to work as a systems developer, and basketball coach for high school  UPDATE October 05 2022: She is accompanied by her significant other Sonny at today's clinical visit, she no longer has daytime spells, today her main concern is nighttime body jerking episode, that happened during her sleep, it happened on a nightly basis, 2-4 times a day, herself did not aware of that, Sonny  aware a lot of that her body jerking movement, hand becomes stiff, lasting for few seconds, can happen in the period of time when she is drifting into sleep, or during sleep, patient denies tongue biting or urinary incontinence,  Sonny has child with epilepsy, she is concerned that  the above described spells might represent seizure Lucianne continues her Trileptal  150 mg twice a day, also taking Effexor  75 mg daily, mood is under better control  She complains of excessive daytime sleepiness, fatigue,  Update April 21, 2023 SS: Saw Dr. Buck in March 2024, recommended sleep study, was not completed, she missed it. Ordered 72 hour EEG by Dr. Onita. Wanted sleep study due to twitching in her sleep, daytime sleepiness, still same symptoms. Still spells at night, wrists will be locked, sore every morning, seizures? Teeth are sore in AM, no bite mark, no incontinence. No daytime spells. Never heard about 72 hours EEG. Remains on Trileptal  150 mg BID, Effexor  XR 75 mg daily. Is teaching special ED in elementary, feels a lot of stress.  ESS 10.  Update 12/01/23 SS: Routine EEG was normal Jan 2025. PSG was unrevealing, minimal sleep fragmentation, no abnormal movement or twitching, no apneas. Reports unusual sleep spells have resolved, sleeping well, thinks prior due to stress from work (spells stopped around October 2024). No seizures. Remains on Trileptal  150 mg BID. Right now working on [employer], trying to go into dietitian. No insurance right now.   Update 07/19/24 SS: Labs at last visit Trileptal  level 6, CMP normal. 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 No seizures. Remains on Trileptal  150 mg BID, Effexor  XR 75 mg daily. Mood is good. No further sleep disturbance.    REVIEW OF SYSTEMS: Out of a complete 14 system review of symptoms, the patient complains only of the following symptoms, and all other reviewed systems are negative.   See HPI  ALLERGIES: Allergies  Allergen Reactions   Lamotrigine  Rash    HOME MEDICATIONS: Outpatient Medications Prior to Visit  Medication Sig Dispense Refill   OXcarbazepine  (TRILEPTAL ) 150 MG tablet Take 1 tablet (150 mg total) by mouth 2 (two) times daily. 180 tablet 2   venlafaxine  XR (EFFEXOR -XR) 75 MG 24 hr capsule Take 1 capsule (75 mg  total) by mouth daily. 90 capsule 1   No facility-administered medications prior to visit.    PAST MEDICAL HISTORY: Past Medical History:  Diagnosis Date   History of concussion    high school while playing basketball   Seizure-like activity (HCC)     PAST SURGICAL HISTORY: No past surgical history on file.  FAMILY HISTORY: Family History  Problem Relation Age of Onset   Headache Mother    Hypertension Father    Sleep apnea Father    Colon cancer Maternal Grandfather    Lung cancer Paternal Grandfather     SOCIAL HISTORY: Social History   Socioeconomic History   Marital status: Single    Spouse name: Not on file   Number of children: 0   Years of education:  college   Highest education level: Bachelor's degree (e.g., BA, AB, BS)  Occupational History   Occupation: special needs teacher  Tobacco Use   Smoking status: Never   Smokeless tobacco: Never  Vaping Use   Vaping status: Never Used  Substance and Sexual Activity   Alcohol use: Yes    Alcohol/week: 1.0 standard drink of alcohol    Types: 1 Standard drinks or equivalent per week    Comment: rarely  Drug use: Never   Sexual activity: Not Currently  Other Topics Concern   Not on file  Social History Narrative   Lives at home alone.   Left-handed.   Occasional caffeine use.   Social Drivers of Health   Tobacco Use: Low Risk (12/01/2023)   Patient History    Smoking Tobacco Use: Never    Smokeless Tobacco Use: Never    Passive Exposure: Not on file  Financial Resource Strain: Not on file  Food Insecurity: Not on file  Transportation Needs: Not on file  Physical Activity: Not on file  Stress: Not on file  Social Connections: Not on file  Intimate Partner Violence: Not on file  Depression (EYV7-0): Not on file  Alcohol Screen: Not on file  Housing: Not on file  Utilities: Not on file  Health Literacy: Not on file   PHYSICAL EXAM  There were no vitals filed for this visit.  There is no  height or weight on file to calculate BMI.  Virtual visit  DIAGNOSTIC DATA (LABS, IMAGING, TESTING) - I reviewed patient records, labs, notes, testing and imaging myself where available.  Lab Results  Component Value Date   WBC 3.6 04/21/2023   HGB 13.5 04/21/2023   HCT 40.5 04/21/2023   MCV 96 04/21/2023   PLT 206 04/21/2023      Component Value Date/Time   NA 138 12/01/2023 0852   K 4.1 12/01/2023 0852   CL 103 12/01/2023 0852   CO2 24 12/01/2023 0852   GLUCOSE 103 (H) 12/01/2023 0852   GLUCOSE 107 (H) 06/20/2018 1320   BUN 10 12/01/2023 0852   CREATININE 0.90 12/01/2023 0852   CALCIUM 9.2 12/01/2023 0852   PROT 6.4 12/01/2023 0852   ALBUMIN 4.3 12/01/2023 0852   AST 13 12/01/2023 0852   ALT 9 12/01/2023 0852   ALKPHOS 76 12/01/2023 0852   BILITOT 0.6 12/01/2023 0852   GFRNONAA 80 10/05/2019 0812   GFRAA 92 10/05/2019 0812   No results found for: CHOL, HDL, LDLCALC, LDLDIRECT, TRIG, CHOLHDL No results found for: YHAJ8R No results found for: CPUJFPWA87 Lab Results  Component Value Date   TSH 2.670 04/21/2023   Lauraine Gayland MANDES, DNP  [facility] [address], [address] [place], [place] [address] [phone]

## 2024-07-19 NOTE — Patient Instructions (Signed)
 Great to see you today! Continue current medications Call for seizure-like activity Follow-up in 6 months.  Thanks!!

## 2025-02-08 ENCOUNTER — Ambulatory Visit: Payer: Self-pay | Admitting: Neurology
